# Patient Record
Sex: Female | Born: 1970 | Hispanic: Yes | Marital: Single | Smoking: Never smoker
Health system: Southern US, Community
[De-identification: ages and names within clinical notes are randomized; demographics above are authoritative.]

## PROBLEM LIST (undated history)

## (undated) DIAGNOSIS — E66811 Obesity, class 1: Secondary | ICD-10-CM

## (undated) DIAGNOSIS — E669 Obesity, unspecified: Secondary | ICD-10-CM

## (undated) DIAGNOSIS — I10 Essential (primary) hypertension: Secondary | ICD-10-CM

## (undated) DIAGNOSIS — E119 Type 2 diabetes mellitus without complications: Secondary | ICD-10-CM

## (undated) HISTORY — DX: Type 2 diabetes mellitus without complications: E11.9

## (undated) HISTORY — PX: TUBAL LIGATION: SHX77

## (undated) HISTORY — DX: Obesity, class 1: E66.811

## (undated) HISTORY — DX: Obesity, unspecified: E66.9

## (undated) HISTORY — DX: Essential (primary) hypertension: I10

---

## 2014-10-30 DIAGNOSIS — I10 Essential (primary) hypertension: Secondary | ICD-10-CM | POA: Insufficient documentation

## 2014-10-30 DIAGNOSIS — E119 Type 2 diabetes mellitus without complications: Secondary | ICD-10-CM

## 2014-10-30 HISTORY — DX: Essential (primary) hypertension: I10

## 2014-10-30 HISTORY — DX: Type 2 diabetes mellitus without complications: E11.9

## 2017-07-20 ENCOUNTER — Encounter: Payer: Self-pay | Admitting: Family Medicine

## 2017-07-20 ENCOUNTER — Ambulatory Visit (INDEPENDENT_AMBULATORY_CARE_PROVIDER_SITE_OTHER): Payer: Self-pay | Admitting: Family Medicine

## 2017-07-20 VITALS — BP 201/115 | HR 88 | Temp 98.9°F | Resp 16 | Ht 60.0 in | Wt 149.0 lb

## 2017-07-20 DIAGNOSIS — I10 Essential (primary) hypertension: Secondary | ICD-10-CM

## 2017-07-20 MED ORDER — LISINOPRIL-HYDROCHLOROTHIAZIDE 20-12.5 MG PO TABS: 1.0000 | ORAL_TABLET | Freq: Every day | ORAL | 2 refills | Status: DC

## 2017-07-20 NOTE — Patient Instructions (Signed)
     IF you received an x-ray today, you will receive an invoice from [facility]. Please contact [facility] at [phone] with questions or concerns regarding your invoice.   IF you received labwork today, you will receive an invoice from [facility]. Please contact [facility] at [phone] with questions or concerns regarding your invoice.   Our billing staff will not be able to assist you with questions regarding bills from these companies.  You will be contacted with the lab results as soon as they are available. The fastest way to get your results is to activate your My Chart account. Instructions are located on the last page of this paperwork. If you have not heard from us regarding the results in 2 weeks, please contact this office.     

## 2017-07-20 NOTE — Progress Notes (Signed)
9/21/20183:00 PM  Tamara Skinner 1971/06/04, 46 y.o. female [id number]  Chief Complaint  Patient presents with  . Blood Pressure Check    HPI:   Patient is a 46 y.o. female who presents today for BP check. She states she was at the dentist yesterday and was found to have elevated BP, similar to today.  She states she has been told in the past she had elevated BP. She reports very strong family history of HTN with all her siblings having She reports her father has had 4 strokes.  She has never taken medication for HTN  She denies regular NSAID, caffeine use.   She does not smoke.  She reports mild headache but otherwise is feeling fine.  She last saw a medical provider about 6 years ago.  Depression screen PHQ 2/9 07/20/2017  Decreased Interest 0  Down, Depressed, Hopeless 0  PHQ - 2 Score 0    Not on File  No current outpatient prescriptions on file prior to visit.   No current facility-administered medications on file prior to visit.     Past Medical History:  Diagnosis Date  . Hypertension     No past surgical history on file.  Social History  Substance Use Topics  . Smoking status: Never Smoker  . Smokeless tobacco: Never Used  . Alcohol use Not on file    Family History  Problem Relation Age of Onset  . Diabetes Mother   . Hypertension Father     Review of Systems  Constitutional: Negative for chills and fever.  Eyes: Negative for blurred vision and double vision.  Respiratory: Negative for cough and shortness of breath.   Cardiovascular: Negative for chest pain, palpitations, orthopnea, leg swelling and PND.  Gastrointestinal: Negative for abdominal pain, nausea and vomiting.  Genitourinary: Negative for hematuria.  Musculoskeletal: Negative for back pain.  Neurological: Positive for headaches. Negative for dizziness, sensory change, speech change and focal weakness.     OBJECTIVE:  Blood pressure (!) 201/115, pulse 88, temperature 98.9 F  (37.2 C), temperature source Oral, resp. rate 16, height 5' (1.524 m), weight 149 lb (67.6 kg), SpO2 98 %.  Physical Exam  Constitutional: She is oriented to person, place, and time and well-developed, well-nourished, and in no distress.  HENT:  Head: Normocephalic and atraumatic.  Mouth/Throat: Oropharynx is clear and moist. No oropharyngeal exudate.  Eyes: Pupils are equal, round, and reactive to light. EOM are normal. No scleral icterus.  Neck: Neck supple. Carotid bruit is not present. No thyroid mass and no thyromegaly present.  Cardiovascular: Normal rate, regular rhythm, S1 normal, S2 normal and intact distal pulses.  Exam reveals no gallop and no friction rub.   Murmur heard.  Decrescendo systolic murmur is present with a grade of 2/6  Pulmonary/Chest: Effort normal and breath sounds normal. She has no wheezes. She has no rales.  Abdominal: Soft. Bowel sounds are normal. She exhibits no distension and no mass. There is no tenderness.  Musculoskeletal: She exhibits no edema.  Neurological: She is alert and oriented to person, place, and time. Gait normal.  Skin: Skin is warm and dry.    No results found for this or any previous visit.   ASSESSMENT and PLAN:  1. Essential hypertension, benign Discussed LFM, importance of low salt, low fat diet and regular exercise. Starting on ACE/HCTZ. Discussed new med r/se/b. Labs per below.  ER precautions given.  - CBC with Differential - Comprehensive metabolic panel - TSH - Lipid panel -  EKG 12-Lead - NSR, HR 79, LAD, LAE, nonspecific inverted T waves V1, V2  Meds ordered this encounter  Medications  . lisinopril-hydrochlorothiazide (ZESTORETIC) 20-12.5 MG tablet    Sig: Take 1 tablet by mouth daily.    Dispense:  30 tablet    Refill:  2          [name] Guadalupe Dawn, MD Primary Care at [facility] [address] [place], [place] [address] Ph.  [phone] Fax [phone]

## 2017-07-21 LAB — COMPREHENSIVE METABOLIC PANEL
ALT: 90 IU/L — ABNORMAL HIGH (ref 0–32)
AST: 74 IU/L — ABNORMAL HIGH (ref 0–40)
Albumin/Globulin Ratio: 1.1 — ABNORMAL LOW (ref 1.2–2.2)
Albumin: 4.1 g/dL (ref 3.5–5.5)
Alkaline Phosphatase: 153 IU/L — ABNORMAL HIGH (ref 39–117)
BUN/Creatinine Ratio: 19 (ref 9–23)
BUN: 10 mg/dL (ref 6–24)
Bilirubin Total: 0.3 mg/dL (ref 0.0–1.2)
CO2: 26 mmol/L (ref 20–29)
Calcium: 9.2 mg/dL (ref 8.7–10.2)
Chloride: 100 mmol/L (ref 96–106)
Creatinine, Ser: 0.54 mg/dL — ABNORMAL LOW (ref 0.57–1.00)
GFR calc Af Amer: 129 mL/min/{1.73_m2} (ref >59)
GFR calc non Af Amer: 112 mL/min/{1.73_m2} (ref >59)
Globulin, Total: 3.6 g/dL (ref 1.5–4.5)
Glucose: 255 mg/dL — ABNORMAL HIGH (ref 65–99)
Potassium: 4.2 mmol/L (ref 3.5–5.2)
Sodium: 139 mmol/L (ref 134–144)
Total Protein: 7.7 g/dL (ref 6.0–8.5)

## 2017-07-21 LAB — CBC WITH DIFFERENTIAL/PLATELET
Basophils Absolute: 0 10*3/uL (ref 0.0–0.2)
Basos: 0 %
EOS (ABSOLUTE): 0.1 10*3/uL (ref 0.0–0.4)
Eos: 1 %
Hematocrit: 37.8 % (ref 34.0–46.6)
Hemoglobin: 11.2 g/dL (ref 11.1–15.9)
Immature Grans (Abs): 0 10*3/uL (ref 0.0–0.1)
Immature Granulocytes: 0 %
Lymphocytes Absolute: 1.1 10*3/uL (ref 0.7–3.1)
Lymphs: 18 %
MCH: 20.1 pg — ABNORMAL LOW (ref 26.6–33.0)
MCHC: 29.6 g/dL — ABNORMAL LOW (ref 31.5–35.7)
MCV: 68 fL — ABNORMAL LOW (ref 79–97)
Monocytes Absolute: 0.5 10*3/uL (ref 0.1–0.9)
Monocytes: 8 %
Neutrophils Absolute: 4.6 10*3/uL (ref 1.4–7.0)
Neutrophils: 73 %
Platelets: 259 10*3/uL (ref 150–379)
RBC: 5.58 x10E6/uL — ABNORMAL HIGH (ref 3.77–5.28)
RDW: 19.9 % — ABNORMAL HIGH (ref 12.3–15.4)
WBC: 6.3 10*3/uL (ref 3.4–10.8)

## 2017-07-21 LAB — LIPID PANEL
Chol/HDL Ratio: 3.6 ratio (ref 0.0–4.4)
Cholesterol, Total: 164 mg/dL (ref 100–199)
HDL: 46 mg/dL (ref >39)
LDL Calculated: 74 mg/dL (ref 0–99)
Triglycerides: 219 mg/dL — ABNORMAL HIGH (ref 0–149)
VLDL Cholesterol Cal: 44 mg/dL — ABNORMAL HIGH (ref 5–40)

## 2017-07-21 LAB — TSH: TSH: 1.8 u[IU]/mL (ref 0.450–4.500)

## 2017-07-27 ENCOUNTER — Encounter: Payer: Self-pay | Admitting: Family Medicine

## 2017-07-27 ENCOUNTER — Ambulatory Visit (INDEPENDENT_AMBULATORY_CARE_PROVIDER_SITE_OTHER): Payer: Self-pay | Admitting: Family Medicine

## 2017-07-27 VITALS — BP 184/98 | HR 75 | Temp 98.3°F | Resp 18 | Ht 59.09 in | Wt 148.6 lb

## 2017-07-27 DIAGNOSIS — E1165 Type 2 diabetes mellitus with hyperglycemia: Secondary | ICD-10-CM

## 2017-07-27 DIAGNOSIS — Z5989 Other problems related to housing and economic circumstances: Secondary | ICD-10-CM | POA: Insufficient documentation

## 2017-07-27 DIAGNOSIS — Z598 Other problems related to housing and economic circumstances: Secondary | ICD-10-CM

## 2017-07-27 DIAGNOSIS — I10 Essential (primary) hypertension: Secondary | ICD-10-CM

## 2017-07-27 DIAGNOSIS — IMO0001 Reserved for inherently not codable concepts without codable children: Secondary | ICD-10-CM

## 2017-07-27 DIAGNOSIS — R7309 Other abnormal glucose: Secondary | ICD-10-CM

## 2017-07-27 LAB — POCT GLYCOSYLATED HEMOGLOBIN (HGB A1C): Hemoglobin A1C: 8.4

## 2017-07-27 MED ORDER — LISINOPRIL 40 MG PO TABS: 40.0000 mg | ORAL_TABLET | Freq: Every day | ORAL | 2 refills | Status: DC

## 2017-07-27 MED ORDER — CHLORTHALIDONE 25 MG PO TABS
25.0000 mg | ORAL_TABLET | Freq: Every day | ORAL | 2 refills | Status: DC
Start: 2017-07-27 — End: 2018-04-19

## 2017-07-27 NOTE — Progress Notes (Signed)
9/28/20181:13 PM  Tamara Skinner 02-08-1971, 46 y.o. female [id number]  Chief Complaint  Patient presents with  . Hypertension    f/u    HPI:   Patient is a 46 y.o. female with past medical history significant for HTN who presents today for fu. Tolerating new medication well, no side effects, feels BP control not lasting all day as she will get mild headaches about after 8 hours of taking medication.   Otherwise has no acute concerns today  Depression screen [facility] 2/9 07/27/2017 07/20/2017  Decreased Interest 0 0  Down, Depressed, Hopeless 0 0  PHQ - 2 Score 0 0    No Known Allergies  No current outpatient prescriptions on file prior to visit.   No current facility-administered medications on file prior to visit.     Past Medical History:  Diagnosis Date  . Hypertension     History reviewed. No pertinent surgical history.  Social History  Substance Use Topics  . Smoking status: Never Smoker  . Smokeless tobacco: Never Used  . Alcohol use No    Family History  Problem Relation Age of Onset  . Diabetes Mother   . Hypertension Father     Review of Systems  Constitutional: Negative for chills and fever.  Respiratory: Negative for cough and shortness of breath.   Cardiovascular: Negative for chest pain, palpitations and leg swelling.  Gastrointestinal: Negative for abdominal pain, nausea and vomiting.     OBJECTIVE:  Blood pressure (!) 198/100, pulse 75, temperature 98.3 F (36.8 C), temperature source Oral, resp. rate 18, height 4' 11.09" (1.501 m), weight 148 lb 9.6 oz (67.4 kg).  Physical Exam  Constitutional: She is oriented to person, place, and time and well-developed, well-nourished, and in no distress.  HENT:  Head: Normocephalic and atraumatic.  Mouth/Throat: Oropharynx is clear and moist. No oropharyngeal exudate.  Eyes: Pupils are equal, round, and reactive to light. EOM are normal. No scleral icterus.  Neck: Neck supple.  Cardiovascular: Normal  rate, regular rhythm and normal heart sounds.  Exam reveals no gallop and no friction rub.   No murmur heard. Pulmonary/Chest: Effort normal and breath sounds normal. She has no wheezes. She has no rales.  Musculoskeletal: She exhibits no edema.  Neurological: She is alert and oriented to person, place, and time. Gait normal.  Skin: Skin is warm and dry.    Recent Results (from the past 2160 hour(s))  CBC with Differential     Status: Abnormal   Collection Time: 07/20/17 12:25 PM  Result Value Ref Range   WBC 6.3 3.4 - 10.8 x10E3/uL   RBC 5.58 (H) 3.77 - 5.28 x10E6/uL   Hemoglobin 11.2 11.1 - 15.9 g/dL   Hematocrit 44.0 10.2 - 46.6 %   MCV 68 (L) 79 - 97 fL   MCH 20.1 (L) 26.6 - 33.0 pg   MCHC 29.6 (L) 31.5 - 35.7 g/dL   RDW 72.5 (H) 36.6 - 44.0 %   Platelets 259 150 - 379 x10E3/uL   Neutrophils 73 Not Estab. %   Lymphs 18 Not Estab. %   Monocytes 8 Not Estab. %   Eos 1 Not Estab. %   Basos 0 Not Estab. %   Neutrophils Absolute 4.6 1.4 - 7.0 x10E3/uL   Lymphocytes Absolute 1.1 0.7 - 3.1 x10E3/uL   Monocytes Absolute 0.5 0.1 - 0.9 x10E3/uL   EOS (ABSOLUTE) 0.1 0.0 - 0.4 x10E3/uL   Basophils Absolute 0.0 0.0 - 0.2 x10E3/uL   Immature Granulocytes 0 Not  Estab. %   Immature Grans (Abs) 0.0 0.0 - 0.1 x10E3/uL  Comprehensive metabolic panel     Status: Abnormal   Collection Time: 07/20/17 12:25 PM  Result Value Ref Range   Glucose 255 (H) 65 - 99 mg/dL   BUN 10 6 - 24 mg/dL   Creatinine, Ser 1.61 (L) 0.57 - 1.00 mg/dL   GFR calc non Af Amer 112 >59 mL/min/1.73   GFR calc Af Amer 129 >59 mL/min/1.73   BUN/Creatinine Ratio 19 9 - 23   Sodium 139 134 - 144 mmol/L   Potassium 4.2 3.5 - 5.2 mmol/L   Chloride 100 96 - 106 mmol/L   CO2 26 20 - 29 mmol/L   Calcium 9.2 8.7 - 10.2 mg/dL   Total Protein 7.7 6.0 - 8.5 g/dL   Albumin 4.1 3.5 - 5.5 g/dL   Globulin, Total 3.6 1.5 - 4.5 g/dL   Albumin/Globulin Ratio 1.1 (L) 1.2 - 2.2   Bilirubin Total 0.3 0.0 - 1.2 mg/dL   Alkaline  Phosphatase 153 (H) 39 - 117 IU/L   AST 74 (H) 0 - 40 IU/L   ALT 90 (H) 0 - 32 IU/L  TSH     Status: None   Collection Time: 07/20/17 12:25 PM  Result Value Ref Range   TSH 1.800 0.450 - 4.500 uIU/mL  Lipid panel     Status: Abnormal   Collection Time: 07/20/17 12:25 PM  Result Value Ref Range   Cholesterol, Total 164 100 - 199 mg/dL   Triglycerides 096 (H) 0 - 149 mg/dL   HDL 46 >04 mg/dL   VLDL Cholesterol Cal 44 (H) 5 - 40 mg/dL   LDL Calculated 74 0 - 99 mg/dL   Chol/HDL Ratio 3.6 0.0 - 4.4 ratio    Comment:                                   T. Chol/HDL Ratio                                             Men  Women                               1/2 Avg.Risk  3.4    3.3                                   Avg.Risk  5.0    4.4                                2X Avg.Risk  9.6    7.1                                3X Avg.Risk 23.4   11.0   POCT glycosylated hemoglobin (Hb A1C)     Status: None   Collection Time: 07/27/17  9:58 AM  Result Value Ref Range   Hemoglobin A1C 8.4      ASSESSMENT and PLAN  1. Essential hypertension, benign Above goal, continue titration of medications. Discussed importance of medication  adherence, diet and exercise. New meds r/se/b discussed.  - chlorthalidone (HYGROTON) 25 MG tablet; Take 1 tablet (25 mg total) by mouth daily. - lisinopril (PRINIVIL,ZESTRIL) 40 MG tablet; Take 1 tablet (40 mg total) by mouth daily.  2. Other abnormal glucose - POCT glycosylated hemoglobin (Hb A1C)  3. Uncontrolled type 2 diabetes mellitus without complication, without long-term current use of insulin (HCC) New diagnosis given today, A1c 8.4, strong family history. Spent 50% of this 25 minute visit explaining diagnosis, diet, carb counting, exercise. Consider starting low dose metformin at next visit. Patient educational handout given.  4. Uninsured Discussed [employer], provided informative handout.        No Follow-up on  file.    Myles Lipps, MD Primary Care at [facility] [address] [place], [place] [address] Ph.  [phone] Fax [phone]

## 2017-07-27 NOTE — Patient Instructions (Signed)
     IF you received an x-ray today, you will receive an invoice from [facility]. Please contact [facility] at [phone] with questions or concerns regarding your invoice.   IF you received labwork today, you will receive an invoice from [facility]. Please contact [facility] at [phone] with questions or concerns regarding your invoice.   Our billing staff will not be able to assist you with questions regarding bills from these companies.  You will be contacted with the lab results as soon as they are available. The fastest way to get your results is to activate your My Chart account. Instructions are located on the last page of this paperwork. If you have not heard from us regarding the results in 2 weeks, please contact this office.     

## 2017-08-28 ENCOUNTER — Ambulatory Visit (INDEPENDENT_AMBULATORY_CARE_PROVIDER_SITE_OTHER): Payer: Self-pay | Admitting: Family Medicine

## 2017-08-28 ENCOUNTER — Encounter: Payer: Self-pay | Admitting: Family Medicine

## 2017-08-28 VITALS — BP 150/88 | HR 69 | Temp 97.9°F | Resp 18 | Ht 59.09 in | Wt 148.0 lb

## 2017-08-28 DIAGNOSIS — IMO0001 Reserved for inherently not codable concepts without codable children: Secondary | ICD-10-CM

## 2017-08-28 DIAGNOSIS — Z23 Encounter for immunization: Secondary | ICD-10-CM

## 2017-08-28 DIAGNOSIS — E1165 Type 2 diabetes mellitus with hyperglycemia: Secondary | ICD-10-CM

## 2017-08-28 DIAGNOSIS — I10 Essential (primary) hypertension: Secondary | ICD-10-CM

## 2017-08-28 LAB — GLUCOSE, POCT (MANUAL RESULT ENTRY): POC Glucose: 174 mg/dl — AB (ref 70–99)

## 2017-08-28 MED ORDER — LOSARTAN POTASSIUM 100 MG PO TABS: 100.0000 mg | ORAL_TABLET | Freq: Every day | ORAL | 1 refills | Status: DC

## 2017-08-28 MED ORDER — METFORMIN HCL 500 MG PO TABS: 500.0000 mg | ORAL_TABLET | Freq: Two times a day (BID) | ORAL | 1 refills | Status: DC

## 2017-08-28 NOTE — Progress Notes (Signed)
10/30/20188:30 AM  Tamara Skinner Mar 21, 1971, 46 y.o. female [id number]  Chief Complaint  Patient presents with  . Follow-up    DM and HTN    HPI:   Patient is a 46 y.o. female with past medical history significant for HTN and DM2 who presents today for follow-up.  Overall doing ok and has no acute concerns today she is experiencing dry cough since she started the lisinopril She ran out of her chlorthalidone 3 days ago, denies any financial barriers or intolerance Struggling with low carb diet, mostly with soda, trying to work on smaller portions  Depression screen [facility] 2/9 08/28/2017 07/27/2017 07/20/2017  Decreased Interest 0 0 0  Down, Depressed, Hopeless 0 0 0  PHQ - 2 Score 0 0 0    No Known Allergies  Prior to Admission medications   Medication Sig Start Date End Date Taking? Authorizing Provider  lisinopril (PRINIVIL,ZESTRIL) 40 MG tablet Take 1 tablet (40 mg total) by mouth daily. 07/27/17  Yes Myles Lipps, MD  chlorthalidone (HYGROTON) 25 MG tablet Take 1 tablet (25 mg total) by mouth daily. Patient not taking: Reported on 08/28/2017 07/27/17   Myles Lipps, MD    Past Medical History:  Diagnosis Date  . Hypertension     No past surgical history on file.  Social History  Substance Use Topics  . Smoking status: Never Smoker  . Smokeless tobacco: Never Used  . Alcohol use No    Family History  Problem Relation Age of Onset  . Diabetes Mother   . Hypertension Father     Review of Systems  Constitutional: Negative for chills and fever.  Respiratory: Negative for cough and shortness of breath.   Cardiovascular: Negative for chest pain, palpitations and leg swelling.  Gastrointestinal: Negative for abdominal pain, nausea and vomiting.     OBJECTIVE:  Blood pressure (!) 150/88, pulse 69, temperature 97.9 F (36.6 C), temperature source Oral, resp. rate 18, height 4' 11.09" (1.501 m), weight 148 lb (67.1 kg), SpO2 100 %.  Physical Exam    Constitutional: She is oriented to person, place, and time and well-developed, well-nourished, and in no distress.  HENT:  Head: Normocephalic and atraumatic.  Mouth/Throat: Oropharynx is clear and moist. No oropharyngeal exudate.  Eyes: Pupils are equal, round, and reactive to light. EOM are normal. No scleral icterus.  Neck: Neck supple.  Cardiovascular: Normal rate, regular rhythm and normal heart sounds.  Exam reveals no gallop and no friction rub.   No murmur heard. Pulmonary/Chest: Effort normal and breath sounds normal. She has no wheezes. She has no rales.  Musculoskeletal: She exhibits no edema.  Neurological: She is alert and oriented to person, place, and time. Gait normal.  Skin: Skin is warm and dry.     Wt Readings from Last 3 Encounters:  08/28/17 148 lb (67.1 kg)  07/27/17 148 lb 9.6 oz (67.4 kg)  07/20/17 149 lb (67.6 kg)    Results for orders placed or performed in visit on 08/28/17 (from the past 24 hour(s))  POCT glucose (manual entry)     Status: Abnormal   Collection Time: 08/28/17  8:56 AM  Result Value Ref Range   POC Glucose 174 (A) 70 - 99 mg/dl     ASSESSMENT and PLAN  1. Essential hypertension, benign Much improved, changing to ARB. Cont chlorthalidone. Rechecking BMP today. - Basic metabolic panel - losartan (COZAAR) 100 MG tablet; Take 1 tablet (100 mg total) by mouth daily.  2. Uncontrolled type  2 diabetes mellitus without complication, without long-term current use of insulin (HCC) Fasting above goal today, 174.Marland Kitchen. Discussed diet changes. Trouble shooting around struggles. Adding metformin, r/se/b discussed. - POCT glucose (manual entry) - Microalbumin/Creatinine Ratio, Urine - metFORMIN (GLUCOPHAGE) 500 MG tablet; Take 1 tablet (500 mg total) by mouth 2 (two) times daily with a meal.   3. Need for vaccination Given today. - Pneumococcal conjugate vaccine 13-valent IM - Tdap vaccine greater than or equal to 7yo IM   Return in about 2  months (around 10/28/2017).    Myles LippsIrma M Santiago, MD Primary Care at [facility] [address] [place], [place] [address] Ph.  [phone] Fax [phone]

## 2017-08-28 NOTE — Assessment & Plan Note (Signed)
BP significantly improved. Off chorthalidone for past 3 days.  Changing ACE to ARB due to cough.

## 2017-08-28 NOTE — Patient Instructions (Signed)
     IF you received an x-ray today, you will receive an invoice from [facility]. Please contact [facility] at [phone] with questions or concerns regarding your invoice.   IF you received labwork today, you will receive an invoice from [facility]. Please contact [facility] at [phone] with questions or concerns regarding your invoice.   Our billing staff will not be able to assist you with questions regarding bills from these companies.  You will be contacted with the lab results as soon as they are available. The fastest way to get your results is to activate your My Chart account. Instructions are located on the last page of this paperwork. If you have not heard from us regarding the results in 2 weeks, please contact this office.    We recommend that you schedule a mammogram for breast cancer screening. Typically, you do not need a referral to do this. Please contact a local imaging center to schedule your mammogram.  [facility] - [phone]  *ask for the Radiology Department [facility]) - [phone] or [phone]  [facility] - [phone] [facility] - [phone] [facility] - [phone]  *ask for the Radiology Department [facility] - [phone]  *ask for the Radiology Department [facility] - [phone]  *ask for the Mammography Department [facility] - [phone] 

## 2017-08-29 LAB — MICROALBUMIN / CREATININE URINE RATIO
Creatinine, Urine: 80.8 mg/dL
Microalb/Creat Ratio: 45.4 mg/g creat — ABNORMAL HIGH (ref 0.0–30.0)
Microalbumin, Urine: 36.7 ug/mL

## 2017-08-29 LAB — BASIC METABOLIC PANEL
BUN/Creatinine Ratio: 30 — ABNORMAL HIGH (ref 9–23)
BUN: 19 mg/dL (ref 6–24)
CO2: 24 mmol/L (ref 20–29)
Calcium: 9.3 mg/dL (ref 8.7–10.2)
Chloride: 97 mmol/L (ref 96–106)
Creatinine, Ser: 0.63 mg/dL (ref 0.57–1.00)
GFR calc Af Amer: 123 mL/min/{1.73_m2} (ref >59)
GFR calc non Af Amer: 106 mL/min/{1.73_m2} (ref >59)
Glucose: 165 mg/dL — ABNORMAL HIGH (ref 65–99)
Potassium: 4.1 mmol/L (ref 3.5–5.2)
Sodium: 135 mmol/L (ref 134–144)

## 2017-12-28 ENCOUNTER — Other Ambulatory Visit: Payer: Self-pay | Admitting: Family Medicine

## 2018-02-23 ENCOUNTER — Other Ambulatory Visit: Payer: Self-pay | Admitting: Family Medicine

## 2018-04-19 ENCOUNTER — Ambulatory Visit: Payer: Self-pay | Admitting: Internal Medicine

## 2018-04-19 ENCOUNTER — Encounter: Payer: Self-pay | Admitting: Internal Medicine

## 2018-04-19 VITALS — BP 220/120 | HR 80 | Resp 12 | Ht <= 58 in | Wt 152.0 lb

## 2018-04-19 DIAGNOSIS — IMO0001 Reserved for inherently not codable concepts without codable children: Secondary | ICD-10-CM

## 2018-04-19 DIAGNOSIS — Z6831 Body mass index (BMI) 31.0-31.9, adult: Secondary | ICD-10-CM

## 2018-04-19 DIAGNOSIS — E669 Obesity, unspecified: Secondary | ICD-10-CM

## 2018-04-19 DIAGNOSIS — E1165 Type 2 diabetes mellitus with hyperglycemia: Secondary | ICD-10-CM

## 2018-04-19 DIAGNOSIS — I10 Essential (primary) hypertension: Secondary | ICD-10-CM

## 2018-04-19 LAB — GLUCOSE, POCT (MANUAL RESULT ENTRY): POC Glucose: 194 mg/dl — AB (ref 70–99)

## 2018-04-19 MED ORDER — HYDROCHLOROTHIAZIDE 25 MG PO TABS
ORAL_TABLET | ORAL | 11 refills | Status: DC
Start: 2018-04-19 — End: 2019-04-16

## 2018-04-19 MED ORDER — METFORMIN HCL 1000 MG PO TABS: 1000.0000 mg | ORAL_TABLET | Freq: Two times a day (BID) | ORAL | 11 refills | Status: DC

## 2018-04-19 MED ORDER — LOSARTAN POTASSIUM 100 MG PO TABS: ORAL_TABLET | ORAL | 11 refills | Status: DC

## 2018-04-19 NOTE — Progress Notes (Signed)
Subjective:    Patient ID: Tamara Skinner, female    DOB: 01/12/1971, 47 y.o.   MRN: [id number]  HPI   Here to establish Out of meds since April.  1.  Essential Hypertension:  Diagnosed 2 years ago.  Has been taking Losartan 100 mg daily and Chlorthalidone 25 mg daily.  Her bp was not at goal with that combination. Was following [facility] previously, but unable to continue there as too expensive.  2.  DM:  Diagnosed 2 years ago.   Last A1C was 8.4% on 07/27/2017. Most recently taking Metformin 500 mg twice daily with meals.    3.  Dyslipidemia:  07/20/2017 LDL not quite at goal at 74 and triglycerides elevated at 219.  Rest of panel at goal.  Lipid Panel     Component Value Date/Time   CHOL 164 07/20/2017 1225   TRIG 219 (H) 07/20/2017 1225   HDL 46 07/20/2017 1225   CHOLHDL 3.6 07/20/2017 1225   LDLCALC 74 07/20/2017 1225   4.  Obesity:  States her job already has too much physical activity.  Up at 6 a.m.  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  10 a.m.:  First time she eats at her work break.  Cereal--corn flakes.  Bananas, apples, almonds or other nuts.  Small amount 2% milk.  May have white bread.  1 p.m.:  2 fried eggs.  Corn oil to fry.  Boiled beans.  Sometimes chicken.  Sometimes salad:  Tomato, spinach, onion, broccoli with lime juice. Water, sometimes Fanta orange.  Home between 5-6 p.m.  5 p.m.:  [place], mushrooms, corn tortilla, Plantains.  Corn on the cob.  Pumpkin.  Vegetable soup with chicken.  Tea.  May play soccer with her grandkids once to twice weekly.  1-2 hours.  She feels she is running much of the time.     Current Meds  Medication Sig  . metFORMIN (GLUCOPHAGE) 500 MG tablet TAKE 1 TABLET(500 MG) BY MOUTH TWICE DAILY WITH A MEAL     Allergies  Allergen Reactions  . Lisinopril Cough   Past Medical History:  Diagnosis Date  . Diabetes mellitus without complication (HCC) 2016  . Hypertension 2016        family history includes Diabetes in her mother; Hypertension in her father.   Social History   Socioeconomic History  . Marital status: Single    Spouse name: Not on file  . Number of children: Not on file  . Years of education: Not on file  . Highest education level: Not on file  Occupational History  . Occupation: Engineer, maintenance (IT)Laundry service  Social Needs  . Financial resource strain: Not on file  . Food insecurity:    Worry: Not on file    Inability: Not on file  . Transportation needs:    Medical: Not on file    Non-medical: Not on file  Tobacco Use  . Smoking status: Never Smoker  . Smokeless tobacco: Never Used  Substance and Sexual Activity  . Alcohol use: No  . Drug use: No  . Sexual activity: Not on file  Lifestyle  . Physical activity:    Days per week: Not on file    Minutes per session: Not on file  . Stress: Not on file  Relationships  . Social connections:    Talks on phone: Not on file    Gets together: Not on file    Attends religious service: Not on  file    Active member of club or organization: Not on file    Attends meetings of clubs or organizations: Not on file    Relationship status: Not on file  . Intimate partner violence:    Fear of current or ex partner: Not on file    Emotionally abused: Not on file    Physically abused: Not on file    Forced sexual activity: Not on file  Other Topics Concern  . Not on file  Social History Narrative   Lives alone in [place]           Review of Systems     Objective:   Physical Exam NAD HEENT:  PERRL EOMI, discs sharp bilaterally, TMs pearly gray, throat without injection. Neck:  Supple, No adenopathy Chest:  CTA CV:  RRR with normal S1 and S2, No S3, S4 or murmur.  No carotid bruits.  Carotid, radial and DP pulses normal and equal. LE:  No edema      Assessment & Plan:  1.  DM:  Increase Metformin to 1000 mg twice daily.  To document blood glucoses.  Follow upin 3 months.  2.   Hypertension:  Losartan 100 mg daily and HCTZ 25 mg daily in morning together. BP and pulse with bmp in 1 week.  3.  Obesity:  Encouraged lifestyle changes with diet and increased daily physical activity.  Fasting labs in 3 months, then with me 2 days later.

## 2018-04-19 NOTE — Patient Instructions (Signed)

## 2018-04-23 ENCOUNTER — Telehealth: Payer: Self-pay | Admitting: Licensed Clinical Social Worker

## 2018-04-23 NOTE — Telephone Encounter (Signed)
LCSW called patient in order to introduce self and provide information about social work services available at the clinic. Left VM. 

## 2018-04-26 ENCOUNTER — Ambulatory Visit: Payer: Self-pay

## 2018-04-26 VITALS — BP 180/110 | HR 80

## 2018-04-26 DIAGNOSIS — I10 Essential (primary) hypertension: Secondary | ICD-10-CM

## 2018-07-19 ENCOUNTER — Other Ambulatory Visit: Payer: Self-pay

## 2018-07-25 ENCOUNTER — Ambulatory Visit: Payer: Self-pay | Admitting: Internal Medicine

## 2018-07-25 ENCOUNTER — Other Ambulatory Visit: Payer: Self-pay

## 2018-07-25 DIAGNOSIS — Z13220 Encounter for screening for lipoid disorders: Secondary | ICD-10-CM

## 2018-07-25 DIAGNOSIS — IMO0001 Reserved for inherently not codable concepts without codable children: Secondary | ICD-10-CM

## 2018-07-25 DIAGNOSIS — Z79899 Other long term (current) drug therapy: Secondary | ICD-10-CM

## 2018-07-25 DIAGNOSIS — E1165 Type 2 diabetes mellitus with hyperglycemia: Principal | ICD-10-CM

## 2018-07-26 LAB — COMPREHENSIVE METABOLIC PANEL
A/G RATIO: 1.2 (ref 1.2–2.2)
ALK PHOS: 97 IU/L (ref 39–117)
ALT: 40 IU/L — AB (ref 0–32)
AST: 38 IU/L (ref 0–40)
Albumin: 4.2 g/dL (ref 3.5–5.5)
BILIRUBIN TOTAL: 0.4 mg/dL (ref 0.0–1.2)
BUN/Creatinine Ratio: 25 — ABNORMAL HIGH (ref 9–23)
BUN: 18 mg/dL (ref 6–24)
CHLORIDE: 98 mmol/L (ref 96–106)
CO2: 25 mmol/L (ref 20–29)
Calcium: 9.4 mg/dL (ref 8.7–10.2)
Creatinine, Ser: 0.72 mg/dL (ref 0.57–1.00)
GFR calc Af Amer: 114 mL/min/{1.73_m2} (ref >59)
GFR calc non Af Amer: 99 mL/min/{1.73_m2} (ref >59)
Globulin, Total: 3.4 g/dL (ref 1.5–4.5)
Glucose: 95 mg/dL (ref 65–99)
POTASSIUM: 3.8 mmol/L (ref 3.5–5.2)
Sodium: 138 mmol/L (ref 134–144)
Total Protein: 7.6 g/dL (ref 6.0–8.5)

## 2018-07-26 LAB — MICROALBUMIN / CREATININE URINE RATIO
Creatinine, Urine: 77.7 mg/dL
MICROALB/CREAT RATIO: 35.3 mg/g{creat} — AB (ref 0.0–30.0)
MICROALBUM., U, RANDOM: 27.4 ug/mL

## 2018-07-26 LAB — LIPID PANEL W/O CHOL/HDL RATIO
Cholesterol, Total: 151 mg/dL (ref 100–199)
HDL: 48 mg/dL (ref >39)
LDL CALC: 85 mg/dL (ref 0–99)
TRIGLYCERIDES: 88 mg/dL (ref 0–149)
VLDL CHOLESTEROL CAL: 18 mg/dL (ref 5–40)

## 2018-07-26 LAB — HGB A1C W/O EAG: HEMOGLOBIN A1C: 6.5 % — AB (ref 4.8–5.6)

## 2018-08-08 ENCOUNTER — Ambulatory Visit: Payer: Self-pay | Admitting: Internal Medicine

## 2018-08-08 ENCOUNTER — Encounter: Payer: Self-pay | Admitting: Internal Medicine

## 2018-08-08 VITALS — BP 200/110 | HR 76 | Resp 12 | Ht <= 58 in | Wt 142.0 lb

## 2018-08-08 DIAGNOSIS — E119 Type 2 diabetes mellitus without complications: Secondary | ICD-10-CM

## 2018-08-08 DIAGNOSIS — I10 Essential (primary) hypertension: Secondary | ICD-10-CM

## 2018-08-08 DIAGNOSIS — E7800 Pure hypercholesterolemia, unspecified: Secondary | ICD-10-CM | POA: Insufficient documentation

## 2018-08-08 DIAGNOSIS — B351 Tinea unguium: Secondary | ICD-10-CM

## 2018-08-08 DIAGNOSIS — K029 Dental caries, unspecified: Secondary | ICD-10-CM

## 2018-08-08 DIAGNOSIS — B353 Tinea pedis: Secondary | ICD-10-CM

## 2018-08-08 DIAGNOSIS — R809 Proteinuria, unspecified: Secondary | ICD-10-CM | POA: Insufficient documentation

## 2018-08-08 LAB — GLUCOSE, POCT (MANUAL RESULT ENTRY): POC Glucose: 140 mg/dl — AB (ref 70–99)

## 2018-08-08 MED ORDER — AGAMATRIX ULTRA-THIN LANCETS MISC: 11 refills | Status: DC

## 2018-08-08 MED ORDER — AGAMATRIX PRESTO W/DEVICE KIT: PACK | 0 refills | Status: AC

## 2018-08-08 MED ORDER — GLUCOSE BLOOD VI STRP: ORAL_STRIP | 11 refills | Status: DC

## 2018-08-08 MED ORDER — AMLODIPINE BESYLATE 5 MG PO TABS: 5.0000 mg | ORAL_TABLET | Freq: Every day | ORAL | 11 refills | Status: DC

## 2018-08-08 NOTE — Progress Notes (Signed)
   Subjective:    Patient ID: Tamara Skinner, female    DOB: 1971/04/05, 47 y.o.   MRN: [id number]  HPI   1.  Essential Hypertension:  Taking Losartan and HCTZ every day.   Microalbumin/crea still a bit high, but improved with recent labs.     2.  DM:  A1C is down to 6.5% from 8.4% 06/2017.  She is not checking sugars at home.  Does not have a glucometer or test strips. Last eye exam:  Never. Pneumococcal not given:  Out in clinic currently. Needs influenza  3.  Elevated LDL/dyslipidemia:  Triglycerides down and LDL up a bit.  Not on any medication as LDL close to goal last check.  Lipid Panel     Component Value Date/Time   CHOL 151 07/25/2018 0900   TRIG 88 07/25/2018 0900   HDL 48 07/25/2018 0900   CHOLHDL 3.6 07/20/2017 1225   LDLCALC 85 07/25/2018 0900    Current Meds  Medication Sig  . hydrochlorothiazide (HYDRODIURIL) 25 MG tablet 1 tab by mouth daily with Losartan in morning  . losartan (COZAAR) 100 MG tablet 1 tab by mouth daily in morning with HCTZ  . metFORMIN (GLUCOPHAGE) 1000 MG tablet Take 1 tablet (1,000 mg total) by mouth 2 (two) times daily with a meal.   Review of Systems     Objective:   Physical Exam  NAD HEENT:  Multiple caried teeth with mild inflammation of surrounding gingiva, especially a broken off tooth in left mandibular gingiva. TMs pearly gray, throat without injection.  Nasal mucosa mildly inflamed with clear discharge Chest:  CTA CV:  RRR with normal S1 and S2, No S3, S4 or murmur.  Radial and DP pulses normal and equal Abd:  S, NT, No HSM or mass, + BS LE:  No edema  Diabetic Foot Exam - Simple   Simple Foot Form Diabetic Foot exam was performed with the following findings:  Yes 08/08/2018 10:43 AM  Visual Inspection No deformities, no ulcerations, no other skin breakdown bilaterally:  Yes See comments:  Yes Sensation Testing Intact to touch and monofilament testing bilaterally:  Yes Pulse Check Posterior Tibialis and Dorsalis pulse  intact bilaterally:  Yes Comments Great toenails bilaterally with discoloration and crumbling, broken off near distal aspect.  Plantar aspects of feet with flaking.        Assessment & Plan:  1.  DM:  Will call when we get Pneumococcal vaccine in. Referral for diabetic eye appt Well controlled currently A1C in 3 months before CPE  2.  Hypertension:  Not controlled and with microalbuminuria, though improved.  Add Amlodipine 5 mg daily.  Nurse visit for bp and pulse check in 1 week.  3.  Tinea pedis and toenail onychomycosis:  Will treat once we have her bp under control with new med.  4.  Elevated ALT:  Recheck with fasting labs in 3 months  5.  Dyslipidemia/elevated LDL:  To work on diet and physical activity to get LDL back down.  6.  Dental decay:  Dental referral.  CPE without pap as had one last year in 3 months

## 2018-08-14 ENCOUNTER — Ambulatory Visit: Payer: Self-pay

## 2018-08-14 VITALS — BP 152/100 | HR 70

## 2018-08-14 DIAGNOSIS — I10 Essential (primary) hypertension: Secondary | ICD-10-CM

## 2018-08-14 NOTE — Progress Notes (Signed)
Patient had only been on additional BP med for 4 days. Patient started amlodipine on Sunday 08/11/18. Patient will come back in 1 week for repeat BP check.. Patient verbalized understanding

## 2018-10-30 DIAGNOSIS — E785 Hyperlipidemia, unspecified: Secondary | ICD-10-CM | POA: Insufficient documentation

## 2018-10-30 HISTORY — DX: Hyperlipidemia, unspecified: E78.5

## 2018-11-19 ENCOUNTER — Other Ambulatory Visit: Payer: Self-pay

## 2018-11-21 ENCOUNTER — Ambulatory Visit: Payer: Self-pay | Admitting: Internal Medicine

## 2018-11-21 ENCOUNTER — Encounter: Payer: Self-pay | Admitting: Internal Medicine

## 2018-11-21 VITALS — BP 180/100 | HR 86 | Resp 12 | Ht <= 58 in | Wt 145.0 lb

## 2018-11-21 DIAGNOSIS — B351 Tinea unguium: Secondary | ICD-10-CM

## 2018-11-21 DIAGNOSIS — Z9189 Other specified personal risk factors, not elsewhere classified: Secondary | ICD-10-CM

## 2018-11-21 DIAGNOSIS — Z114 Encounter for screening for human immunodeficiency virus [HIV]: Secondary | ICD-10-CM

## 2018-11-21 DIAGNOSIS — R748 Abnormal levels of other serum enzymes: Secondary | ICD-10-CM | POA: Insufficient documentation

## 2018-11-21 DIAGNOSIS — Z0000 Encounter for general adult medical examination without abnormal findings: Secondary | ICD-10-CM

## 2018-11-21 DIAGNOSIS — R7401 Elevation of levels of liver transaminase levels: Secondary | ICD-10-CM | POA: Insufficient documentation

## 2018-11-21 DIAGNOSIS — Z1239 Encounter for other screening for malignant neoplasm of breast: Secondary | ICD-10-CM

## 2018-11-21 DIAGNOSIS — E7800 Pure hypercholesterolemia, unspecified: Secondary | ICD-10-CM

## 2018-11-21 DIAGNOSIS — E119 Type 2 diabetes mellitus without complications: Secondary | ICD-10-CM

## 2018-11-21 DIAGNOSIS — I10 Essential (primary) hypertension: Secondary | ICD-10-CM

## 2018-11-21 DIAGNOSIS — B353 Tinea pedis: Secondary | ICD-10-CM | POA: Insufficient documentation

## 2018-11-21 HISTORY — DX: Tinea unguium: B35.1

## 2018-11-21 HISTORY — DX: Tinea pedis: B35.3

## 2018-11-21 HISTORY — DX: Elevation of levels of liver transaminase levels: R74.01

## 2018-11-21 LAB — GLUCOSE, POCT (MANUAL RESULT ENTRY): POC GLUCOSE: 137 mg/dL — AB (ref 70–99)

## 2018-11-21 MED ORDER — TERBINAFINE HCL 250 MG PO TABS: ORAL_TABLET | ORAL | 0 refills | Status: DC

## 2018-11-21 MED ORDER — AMLODIPINE BESYLATE 10 MG PO TABS: ORAL_TABLET | ORAL | 11 refills | Status: DC

## 2018-11-21 NOTE — Progress Notes (Signed)
Subjective:   Patient ID: Tamara Skinner, female    DOB: 29-Jul-1971, 48 y.o.   MRN:   HPI   CPE without pap  1.  Pap:  Last pap was 2 years ago in [place] and normal.  Always normal.  No family history of cervical cancer.  2.  Mammogram:  Never.  No family history of breast cancer.    3.  Osteoprevention:  Eats dairy only once or twice weekly.  Does not like.  She does like almond milk.  She is walking 20 minutes.  Also, used to walk a lot with work, but not working currently.  She can increase her walking to 60 minutes.  4.  Guaiac Cards:  Never  5.  Colonoscopy:  Never.  No family history of colon cancer.  6.  Immunizations:  Immunization History  Administered Date(s) Administered  . Influenza,inj,Quad PF,6+ Mos 08/28/2017  . Influenza-Unspecified 08/07/2018  . Pneumococcal Conjugate-13 08/28/2017  . Tdap 08/28/2017     7.  Glucose/Cholesterol:  A1C in September was excellent at 6.5%. LDL slightly high in September as well.  Goal less than 70.  She is fasting today.  Lipid Panel     Component Value Date/Time   CHOL 151 07/25/2018 0900   TRIG 88 07/25/2018 0900   HDL 48 07/25/2018 0900   CHOLHDL 3.6 07/20/2017 1225   LDLCALC 85 07/25/2018 0900   Also:  Hypertension:  States taking her medication regularly.  Added Amlodipine in October.  Did not follow up as recommended.  Current Meds  Medication Sig  . AGAMATRIX ULTRA-THIN LANCETS MISC Check blood glucose twice weekly before breakfast and twice weekly before dinner.  Marland Kitchen amLODipine (NORVASC) 10 MG tablet 1 tab by mouth daily  . Blood Glucose Monitoring Suppl (AGAMATRIX PRESTO) w/Device KIT Check blood glucose in the morning before breakfast and before dinner each twice weekly  . glucose blood (AGAMATRIX PRESTO TEST) test strip Check blood glucose twice weekly before breakfast and twice weekly before dinner  . hydrochlorothiazide (HYDRODIURIL) 25 MG tablet 1 tab by mouth daily with Losartan in morning  . losartan  (COZAAR) 100 MG tablet 1 tab by mouth daily in morning with HCTZ  . metFORMIN (GLUCOPHAGE) 1000 MG tablet Take 1 tablet (1,000 mg total) by mouth 2 (two) times daily with a meal.  . [DISCONTINUED] amLODipine (NORVASC) 5 MG tablet Take 1 tablet (5 mg total) by mouth daily.    Allergies  Allergen Reactions  . Lisinopril Cough    Past Medical History:  Diagnosis Date  . Diabetes mellitus without complication ([place]) 0626  . Hypertension 2016    Past Surgical History:  Procedure Laterality Date  . TUBAL LIGATION  1927    Family History  Problem Relation Age of Onset  . Diabetes Mother   . Hypertension Father   . Stroke Father        Three strokes  . Diabetes Sister   . Hypertension Sister   . Diabetes Brother   . Hypertension Brother   . Diabetes Brother   . Hypertension Brother     Social History   Socioeconomic History  . Marital status: Single    Spouse name: Not on file  . Number of children: 4  . Years of education: 56  . Highest education level: 12th grade  Occupational History  . Occupation: Currently unemployed  Social Needs  . Financial resource strain: Not on file  . Food insecurity:    Worry: Sometimes true  Inability: Sometimes true  . Transportation needs:    Medical: Yes    Non-medical: Yes  Tobacco Use  . Smoking status: Never Smoker  . Smokeless tobacco: Never Used  Substance and Sexual Activity  . Alcohol use: No  . Drug use: No  . Sexual activity: Yes    Birth control/protection: Surgical  Lifestyle  . Physical activity:    Days per week: 7 days    Minutes per session: 20 min  . Stress: Very much  Relationships  . Social connections:    Talks on phone: More than three times a week    Gets together: Twice a week    Attends religious service: More than 4 times per year    Active member of club or organization: No    Attends meetings of clubs or organizations: Never    Relationship status: Never married  . Intimate partner violence:     Fear of current or ex partner: No    Emotionally abused: No    Physically abused: No    Forced sexual activity: No  Other Topics Concern  . Not on file  Social History Narrative   Moving in with son here in [place] soon as can no longer afford to live alone after losing job.   She will be living with her son, his wife and his 2 children     Review of Systems  Constitutional:       Poor sleep for 4 years.   Regular bedtime.  Difficulty initiating sleep.  Watches TV if unable to fall asleep.  Does like to read.   Wakes after 3-4 hours and unable to fall back to sleep. No napping.    Eyes: Negative for visual disturbance (Missed appt for eyes due to breakdown of car.).  Respiratory: Negative for cough and shortness of breath.   Cardiovascular: Negative for chest pain.  Gastrointestinal: Negative for abdominal pain and blood in stool.     Objective:  Physical Exam:  BP (!) 180/100 (BP Location: Left Arm, Patient Position: Sitting, Cuff Size: Normal)   Pulse 86   Resp 12   Ht _0  (1.448 m)   Wt 145 lb (65.8 kg)   LMP  (LMP Unknown)   BMI 31.38 kg/m .  NAD HEENT:  Has hair shaved to very short length.  PERRL, EOMI, Discs sharp. TMs pearly gray, throat without injection.   Neck: Supple, No adenopathy, no thyromegaly Chest:  CTA Breasts:  No focal mass, skin dimpling, nipple discharge or axillary adenopathy CV:  RRR with normal S1 and S2, No S3, S4 murmur or rub.  No carotid bruits.  Carotid, radial, femoral, DP or PT pulses. Abd:  S, NT, No HSM or mass, + BS GU:  Normal external female genitalia.  No discharge.  No uterine or adnexal mass or tenderness. Rectal:  Refused Skin:  Toenails with discoloration and thickening, crumbling.  Plantar aspect of feet with flaking and dryness.   Assessment & Plan:  1.  CPE Mammogram Guaiac Cards X3 HIV screen   2.  DM:  A1C Re refer for eye exam  3.  Hypertension:  Increase amlodipine to 10 mg daily.  Bp check in 1  week. Discussed needs to follow up regularly until we have her bp under good control.  4.  Elevated LDL:  FLP.  5.   Insomnia: went over sleep hygiene for improvement.  Will refer to SW if no improvement with followup in 3 months  6.  Toenail onychomycosis/Tinea pedis:  Terbinafine 250 mg for 84 days.  Follow up in 6 and 12 weeks.  7.  Hx of elevated ALT:  CMP

## 2018-11-21 NOTE — Patient Instructions (Signed)
For foot and toenail fungus:  Spray your shoes with Lysol or Lotrimin Antifungal spray when you start the medication for your feet.   Re-spray your the shoes you wear with each wearing and allow to dry before wearing again You will need to continue to spray the shoes you have during the infection of your toenails and feet have been thrown out due to wear.  Once your toenails and feet are clear of infection, the shoes you buy new do not necessarily need to be sprayed. Clean your shower floor once to twice daily with bleach containing cleaner. 

## 2018-11-22 LAB — COMPREHENSIVE METABOLIC PANEL
ALT: 58 IU/L — AB (ref 0–32)
AST: 46 IU/L — AB (ref 0–40)
Albumin/Globulin Ratio: 1.2 (ref 1.2–2.2)
Albumin: 4.3 g/dL (ref 3.8–4.8)
Alkaline Phosphatase: 121 IU/L — ABNORMAL HIGH (ref 39–117)
BUN/Creatinine Ratio: 30 — ABNORMAL HIGH (ref 9–23)
BUN: 17 mg/dL (ref 6–24)
Bilirubin Total: 0.3 mg/dL (ref 0.0–1.2)
CO2: 22 mmol/L (ref 20–29)
CREATININE: 0.56 mg/dL — AB (ref 0.57–1.00)
Calcium: 9.7 mg/dL (ref 8.7–10.2)
Chloride: 100 mmol/L (ref 96–106)
GFR calc Af Amer: 127 mL/min/{1.73_m2} (ref >59)
GFR, EST NON AFRICAN AMERICAN: 110 mL/min/{1.73_m2} (ref >59)
Globulin, Total: 3.5 g/dL (ref 1.5–4.5)
Glucose: 122 mg/dL — ABNORMAL HIGH (ref 65–99)
Potassium: 4.4 mmol/L (ref 3.5–5.2)
Sodium: 140 mmol/L (ref 134–144)
TOTAL PROTEIN: 7.8 g/dL (ref 6.0–8.5)

## 2018-11-22 LAB — LIPID PANEL W/O CHOL/HDL RATIO
CHOLESTEROL TOTAL: 163 mg/dL (ref 100–199)
HDL: 53 mg/dL (ref >39)
LDL CALC: 86 mg/dL (ref 0–99)
Triglycerides: 122 mg/dL (ref 0–149)
VLDL Cholesterol Cal: 24 mg/dL (ref 5–40)

## 2018-11-22 LAB — HGB A1C W/O EAG: HEMOGLOBIN A1C: 6.6 % — AB (ref 4.8–5.6)

## 2018-11-22 LAB — HIV ANTIBODY (ROUTINE TESTING W REFLEX): HIV Screen 4th Generation wRfx: NONREACTIVE

## 2018-12-01 ENCOUNTER — Other Ambulatory Visit: Payer: Self-pay | Admitting: Internal Medicine

## 2018-12-04 ENCOUNTER — Ambulatory Visit: Payer: Self-pay

## 2018-12-04 VITALS — BP 158/80 | HR 86

## 2018-12-04 DIAGNOSIS — I10 Essential (primary) hypertension: Secondary | ICD-10-CM

## 2018-12-04 DIAGNOSIS — Z1211 Encounter for screening for malignant neoplasm of colon: Secondary | ICD-10-CM

## 2018-12-04 LAB — POC HEMOCCULT BLD/STL (HOME/3-CARD/SCREEN)
Card #2 Fecal Occult Blod, POC: NEGATIVE
Card #3 Fecal Occult Blood, POC: NEGATIVE
Fecal Occult Blood, POC: NEGATIVE

## 2018-12-05 NOTE — Progress Notes (Signed)
Per Dr. Delrae Alfred have patient come back in 2 weeks and recheck after being on medication consistently for 1 month. Britt Boozer called patient and scheduled.

## 2019-01-02 ENCOUNTER — Encounter: Payer: Self-pay | Admitting: Internal Medicine

## 2019-01-02 ENCOUNTER — Ambulatory Visit: Payer: Self-pay | Admitting: Internal Medicine

## 2019-01-02 VITALS — BP 180/90 | HR 78 | Resp 12 | Ht <= 58 in | Wt 148.0 lb

## 2019-01-02 DIAGNOSIS — B351 Tinea unguium: Secondary | ICD-10-CM

## 2019-01-02 DIAGNOSIS — I10 Essential (primary) hypertension: Secondary | ICD-10-CM

## 2019-01-02 DIAGNOSIS — E119 Type 2 diabetes mellitus without complications: Secondary | ICD-10-CM

## 2019-01-02 DIAGNOSIS — Z79899 Other long term (current) drug therapy: Secondary | ICD-10-CM

## 2019-01-02 LAB — GLUCOSE, POCT (MANUAL RESULT ENTRY): POC Glucose: 140 mg/dl — AB (ref 70–99)

## 2019-01-02 NOTE — Progress Notes (Signed)
    Subjective:    Patient ID: Tamara Skinner, female   DOB: 03-18-1971, 48 y.o.   MRN: [id number]   HPI   1.   Hypertension:  States she is taking her meds.  Brought them, but out in car.    States she has not missed.  2.  Toenails onychomycosis:  Tolerating Terbinafine fine.  Toenails are improving.   She is treating shoes and shower floor as discussed  Current Meds  Medication Sig  . AGAMATRIX ULTRA-THIN LANCETS MISC Check blood glucose twice weekly before breakfast and twice weekly before dinner.  Marland Kitchen amLODipine (NORVASC) 10 MG tablet 1 tab by mouth daily  . aspirin 325 MG tablet Take 325 mg by mouth every 6 (six) hours as needed.  . Blood Glucose Monitoring Suppl (AGAMATRIX PRESTO) w/Device KIT Check blood glucose in the morning before breakfast and before dinner each twice weekly  . glucose blood (AGAMATRIX PRESTO TEST) test strip Check blood glucose twice weekly before breakfast and twice weekly before dinner  . hydrochlorothiazide (HYDRODIURIL) 25 MG tablet 1 tab by mouth daily with Losartan in morning  . losartan (COZAAR) 100 MG tablet 1 tab by mouth daily in morning with HCTZ  . metFORMIN (GLUCOPHAGE) 1000 MG tablet Take 1 tablet (1,000 mg total) by mouth 2 (two) times daily with a meal.  . terbinafine (LAMISIL) 250 MG tablet 1 tab by mouth daily for 84 days.   Allergies  Allergen Reactions  . Lisinopril Cough     Review of Systems    Objective:   BP (!) 180/90 (BP Location: Left Arm, Patient Position: Sitting, Cuff Size: Normal)   Pulse 78   Resp 12   Ht '4\' 9"'$  (1.448 m)   Wt 148 lb (67.1 kg)   BMI 32.03 kg/m   Physical Exam   Great toenails with a bit of polish on them, but clear 4 mm area at base of toenails that is clear and normal thickness bilaterally.   Assessment & Plan   1.  Hypertension:  Brings in bottles from car and appears to have 17 pills more than she should had she been taking her bp pills (amlodipine)  She has about 5 tabs too many of Losartan and  HCTZ than would expect.  States she had those left over from the previous month.  Unable to ascertain why she had meds left over.   Some of the Amlodipine left over as she switched from 5 mg to 10 mg tabs.    2.  Toenail onychomycosis:  Improving on Terbinafine.  Hepatic profile  2. Toenail onychomycosis:  Improving.  Has had minor elevation of transaminases prior to treatment.  Check hepatic profile again today.

## 2019-01-03 LAB — HEPATIC FUNCTION PANEL
ALT: 47 IU/L — ABNORMAL HIGH (ref 0–32)
AST: 37 IU/L (ref 0–40)
Albumin: 4 g/dL (ref 3.8–4.8)
Alkaline Phosphatase: 110 IU/L (ref 39–117)
Bilirubin Total: 0.3 mg/dL (ref 0.0–1.2)
Bilirubin, Direct: 0.11 mg/dL (ref 0.00–0.40)
Total Protein: 7.5 g/dL (ref 6.0–8.5)

## 2019-02-13 ENCOUNTER — Ambulatory Visit: Payer: Self-pay | Admitting: Internal Medicine

## 2019-04-16 ENCOUNTER — Other Ambulatory Visit: Payer: Self-pay | Admitting: Internal Medicine

## 2019-04-17 ENCOUNTER — Encounter: Payer: Self-pay | Admitting: Internal Medicine

## 2019-04-17 ENCOUNTER — Ambulatory Visit: Payer: Self-pay | Admitting: Internal Medicine

## 2019-04-17 ENCOUNTER — Other Ambulatory Visit: Payer: Self-pay

## 2019-04-17 ENCOUNTER — Ambulatory Visit (INDEPENDENT_AMBULATORY_CARE_PROVIDER_SITE_OTHER): Payer: Self-pay | Admitting: Internal Medicine

## 2019-04-17 VITALS — BP 150/82 | HR 70 | Resp 12 | Ht <= 58 in | Wt 152.0 lb

## 2019-04-17 DIAGNOSIS — R748 Abnormal levels of other serum enzymes: Secondary | ICD-10-CM

## 2019-04-17 DIAGNOSIS — E119 Type 2 diabetes mellitus without complications: Secondary | ICD-10-CM

## 2019-04-17 DIAGNOSIS — I10 Essential (primary) hypertension: Secondary | ICD-10-CM

## 2019-04-17 DIAGNOSIS — B351 Tinea unguium: Secondary | ICD-10-CM

## 2019-04-17 DIAGNOSIS — B353 Tinea pedis: Secondary | ICD-10-CM

## 2019-04-17 NOTE — Progress Notes (Signed)
    Subjective:    Patient ID: Tamara Skinner, female   DOB: 11-06-1970, 48 y.o.   MRN: [id number]   HPI   1.  Onychomycosis of great toenails:  States she finished Terbinafine about 1.5 weeks ago and should have finished mid April, 2 months ago.  Feels her nails are better, but not completely cleared yet.   2.  Hypertension:  Did not take her  meds today as she ran out.  3.  DM:  Out of Metformin for at least 1 day  Current Meds  Medication Sig  . AGAMATRIX ULTRA-THIN LANCETS MISC Check blood glucose twice weekly before breakfast and twice weekly before dinner.  Marland Kitchen amLODipine (NORVASC) 10 MG tablet 1 tab by mouth daily  . aspirin 325 MG tablet Take 325 mg by mouth every 6 (six) hours as needed.  . Blood Glucose Monitoring Suppl (AGAMATRIX PRESTO) w/Device KIT Check blood glucose in the morning before breakfast and before dinner each twice weekly  . glucose blood (AGAMATRIX PRESTO TEST) test strip Check blood glucose twice weekly before breakfast and twice weekly before dinner  . hydrochlorothiazide (HYDRODIURIL) 25 MG tablet TAKE 1 TABLET BY MOUTH ONCE DAILY WTIH  LOSARTAN  IN  MORNING.  Marland Kitchen losartan (COZAAR) 100 MG tablet TAKE ONE TABLET BY MOUTH IN MORNING WITH HCTZ.  . metFORMIN (GLUCOPHAGE) 1000 MG tablet TAKE 1 TABLET BY MOUTH TWICE DAILY WITH  A  MEAL.   Allergies  Allergen Reactions  . Lisinopril Cough     Review of Systems    Objective:   BP (!) 150/82 (BP Location: Left Arm, Patient Position: Sitting, Cuff Size: Normal)   Pulse 70   Resp 12   Ht '4\' 9"'$  (1.448 m)   Wt 152 lb (68.9 kg)   LMP  (LMP Unknown)   BMI 32.89 kg/m   Physical Exam Great toenails with 3/4 of nail normal, but distal edge white and  crumbling.   Skin of feet appears normal  Assessment & Plan  1.  Toenail onychomycosis and tinea pedis:  Appears improved, but with inconsistent use of Terbinafine, not sure if this will clear.  No further treatment for now. If toenails start looking worse and would  like retreatment, to let clinic know.  2.  Hypertension:  Discussed at length treatment vs cure and need to never miss her meds unless specifically told to stop by a provider.  To call if she has problems with a med. Return in 1 month for recheck with me on medication.  3.  DM:  As above regarding medication.  4.  Elevated liver enzymes:  Recheck as has finished Terbinafine.  Likely due to fatty liver.  5.  LDL not at goal:  Will need to address at followup--will see if she is able to get back on meds and lifestyle changes.

## 2019-04-18 LAB — HEPATIC FUNCTION PANEL
ALT: 122 IU/L — ABNORMAL HIGH (ref 0–32)
AST: 90 IU/L — ABNORMAL HIGH (ref 0–40)
Albumin: 4.2 g/dL (ref 3.8–4.8)
Alkaline Phosphatase: 118 IU/L — ABNORMAL HIGH (ref 39–117)
Bilirubin Total: 0.3 mg/dL (ref 0.0–1.2)
Bilirubin, Direct: 0.1 mg/dL (ref 0.00–0.40)
Total Protein: 7.6 g/dL (ref 6.0–8.5)

## 2019-04-23 ENCOUNTER — Other Ambulatory Visit: Payer: Self-pay

## 2019-04-23 DIAGNOSIS — R748 Abnormal levels of other serum enzymes: Secondary | ICD-10-CM

## 2019-04-24 LAB — HEPATITIS C ANTIBODY: Hep C Virus Ab: <0.1 s/co ratio (ref 0.0–0.9)

## 2019-04-24 LAB — HEPATITIS B SURFACE ANTIBODY,QUALITATIVE: Hep B Surface Ab, Qual: NONREACTIVE

## 2019-04-24 LAB — HEPATITIS B SURFACE ANTIGEN: Hepatitis B Surface Ag: NEGATIVE

## 2019-04-24 LAB — HEPATITIS A ANTIBODY, TOTAL: hep A Total Ab: POSITIVE — AB

## 2019-04-24 LAB — HEPATITIS B CORE ANTIBODY, TOTAL: Hep B Core Total Ab: NEGATIVE

## 2019-05-22 ENCOUNTER — Encounter: Payer: Self-pay | Admitting: Internal Medicine

## 2019-05-22 ENCOUNTER — Ambulatory Visit: Payer: Self-pay | Admitting: Internal Medicine

## 2019-05-22 ENCOUNTER — Other Ambulatory Visit: Payer: Self-pay

## 2019-05-22 VITALS — BP 138/88 | HR 82 | Resp 12 | Ht <= 58 in | Wt 152.0 lb

## 2019-05-22 DIAGNOSIS — Z23 Encounter for immunization: Secondary | ICD-10-CM

## 2019-05-22 DIAGNOSIS — E119 Type 2 diabetes mellitus without complications: Secondary | ICD-10-CM

## 2019-05-22 DIAGNOSIS — B351 Tinea unguium: Secondary | ICD-10-CM

## 2019-05-22 DIAGNOSIS — I10 Essential (primary) hypertension: Secondary | ICD-10-CM

## 2019-05-22 NOTE — Progress Notes (Signed)
    Subjective:    Patient ID: Tamara Skinner, female   DOB: November 07, 1970, 47 y.o.   MRN: [id number]   HPI   Interpreted by Irving Shows  1.  Hypertension:  BP much improved.  She is walking daily for 30 minutes.  Eating more fruits and vegetables.   Weight is stable at 152# from last visit.   2.  DM:  Has been checking sugars regularly.  Getting sugars 110-130 fasting in the morning. A1C in January was 6.6%, at goal.   Has not had an eye exam, though ordered in January.  Needs Pneumococcal 23 v  3.  HM:  Has not had mammogram ordered in January.  She admits to not having voicemail.  She screens calls as well.    4.  Toenail onychomycosis:  States toenail continues to grow out with thinner abnormal nail at distal end only--mainly right great toenail.   Current Meds  Medication Sig  . AGAMATRIX ULTRA-THIN LANCETS MISC Check blood glucose twice weekly before breakfast and twice weekly before dinner.  Marland Kitchen amLODipine (NORVASC) 10 MG tablet 1 tab by mouth daily  . aspirin 325 MG tablet Take 325 mg by mouth every 6 (six) hours as needed.  . Blood Glucose Monitoring Suppl (AGAMATRIX PRESTO) w/Device KIT Check blood glucose in the morning before breakfast and before dinner each twice weekly  . glucose blood (AGAMATRIX PRESTO TEST) test strip Check blood glucose twice weekly before breakfast and twice weekly before dinner  . hydrochlorothiazide (HYDRODIURIL) 25 MG tablet TAKE 1 TABLET BY MOUTH ONCE DAILY WTIH  LOSARTAN  IN  MORNING.  Marland Kitchen losartan (COZAAR) 100 MG tablet TAKE ONE TABLET BY MOUTH IN MORNING WITH HCTZ.  . metFORMIN (GLUCOPHAGE) 1000 MG tablet TAKE 1 TABLET BY MOUTH TWICE DAILY WITH  A  MEAL.   Allergies  Allergen Reactions  . Lisinopril Cough     Review of Systems    Objective:   BP 138/88 (BP Location: Left Arm, Cuff Size: Normal)   Pulse 82   Resp 12   Ht '4\' 9"'$  (1.448 m)   Wt 152 lb (68.9 kg)   BMI 32.89 kg/m   Physical Exam  NAD HEENT:  PERRL, EOMI Neck:  Supple, No  adenopathy Chest:  CTA CV:  RRR without murmur or rub.  Radial and DP pulses normal and equal. LE:  No edema Toenails:  Fungal damage just at distal aspect of great toenails.     Assessment & Plan  1.  Hypertension:  Better control.  CPM with medication and lifestyle changes.  Goal to be 120/70 range.  2.  DM:  A1C to confim what she describes and what has been good control.    3.  HM:  Pneumococcal vaccine.  Patient to call for mammogram--bre[facility].

## 2019-05-23 LAB — HGB A1C W/O EAG: Hgb A1c MFr Bld: 7.2 % — ABNORMAL HIGH (ref 4.8–5.6)

## 2019-06-06 ENCOUNTER — Other Ambulatory Visit: Payer: Self-pay

## 2019-06-06 DIAGNOSIS — R748 Abnormal levels of other serum enzymes: Secondary | ICD-10-CM

## 2019-06-07 LAB — HEPATIC FUNCTION PANEL
ALT: 87 IU/L — ABNORMAL HIGH (ref 0–32)
AST: 66 IU/L — ABNORMAL HIGH (ref 0–40)
Albumin: 4.2 g/dL (ref 3.8–4.8)
Alkaline Phosphatase: 109 IU/L (ref 39–117)
Bilirubin Total: 0.2 mg/dL (ref 0.0–1.2)
Bilirubin, Direct: 0.09 mg/dL (ref 0.00–0.40)
Total Protein: 7.1 g/dL (ref 6.0–8.5)

## 2019-08-28 ENCOUNTER — Encounter: Payer: Self-pay | Admitting: Internal Medicine

## 2019-08-28 ENCOUNTER — Other Ambulatory Visit: Payer: Self-pay

## 2019-08-28 ENCOUNTER — Ambulatory Visit: Payer: Self-pay | Admitting: Internal Medicine

## 2019-08-28 VITALS — BP 130/88 | HR 84 | Resp 12 | Ht <= 58 in | Wt 145.0 lb

## 2019-08-28 DIAGNOSIS — R748 Abnormal levels of other serum enzymes: Secondary | ICD-10-CM

## 2019-08-28 DIAGNOSIS — Z23 Encounter for immunization: Secondary | ICD-10-CM

## 2019-08-28 DIAGNOSIS — R809 Proteinuria, unspecified: Secondary | ICD-10-CM

## 2019-08-28 DIAGNOSIS — I10 Essential (primary) hypertension: Secondary | ICD-10-CM

## 2019-08-28 DIAGNOSIS — Z1231 Encounter for screening mammogram for malignant neoplasm of breast: Secondary | ICD-10-CM

## 2019-08-28 DIAGNOSIS — E7800 Pure hypercholesterolemia, unspecified: Secondary | ICD-10-CM

## 2019-08-28 DIAGNOSIS — E119 Type 2 diabetes mellitus without complications: Secondary | ICD-10-CM

## 2019-08-28 NOTE — Progress Notes (Signed)
    Subjective:    Patient ID: Tamara Skinner, female   DOB: May 31, 1971, 48 y.o.   MRN: [id number]   HPI  Interpreted  1.  Hypertension:  Has lost 7 lbs since July.  She has been more active.  Taking care of granddaughter and working with [employer]. Continues on Amlodipine, Losartan and HCTZ.  2.  DM:  Sugars in morning 138.  When checks in afternoon, generally 120-140.   Has not had eye exam his year.   3.  HM:  Still has not had mammogram.  Having problems keeping phone costs up and so may not have a working phone at times.    4.  Elevated Liver enzymes:  Plan to have come back for fasting labs.  5.  Mildly elevated LDL at 86 in January.   6.  Toenail onychomycosis and tinea pedis:  Clear still.   Current Meds  Medication Sig  . AGAMATRIX ULTRA-THIN LANCETS MISC Check blood glucose twice weekly before breakfast and twice weekly before dinner.  Marland Kitchen amLODipine (NORVASC) 10 MG tablet 1 tab by mouth daily  . aspirin 325 MG tablet Take 325 mg by mouth every 6 (six) hours as needed.  . Blood Glucose Monitoring Suppl (AGAMATRIX PRESTO) w/Device KIT Check blood glucose in the morning before breakfast and before dinner each twice weekly  . glucose blood (AGAMATRIX PRESTO TEST) test strip Check blood glucose twice weekly before breakfast and twice weekly before dinner  . hydrochlorothiazide (HYDRODIURIL) 25 MG tablet TAKE 1 TABLET BY MOUTH ONCE DAILY WTIH  LOSARTAN  IN  MORNING.  Marland Kitchen losartan (COZAAR) 100 MG tablet TAKE ONE TABLET BY MOUTH IN MORNING WITH HCTZ.  . metFORMIN (GLUCOPHAGE) 1000 MG tablet TAKE 1 TABLET BY MOUTH TWICE DAILY WITH  A  MEAL.   Allergies  Allergen Reactions  . Lisinopril Cough     Review of Systems    Objective:   BP 140/90 (BP Location: Left Arm, Patient Position: Sitting, Cuff Size: Normal)   Pulse 84   Resp 12   Ht '4\' 9"'$  (1.448 m)   Wt 145 lb (65.8 kg)   BMI 31.38 kg/m   Physical Exam NAD HEENT:  PERRL, EOMI, TMs pearly gray. Neck:  Supple, No  adenopathy, no thyromegaly Chest:  CTA CV:  RRR without murmur or rub.  Radial and DP pulses normal and equal. Abd:  S, NT, No HSM or mass, + BS LE:  No edema  Diabetic foot exam was performed with the following findings:   No deformities, ulcerations, or other skin breakdown Normal sensation of 10g monofilament Intact posterior tibialis and dorsalis pedis pulses Toenails clear     Assessment & Plan   1.  Hypertension:  Repeat bp at 130/88, a bit lower, but would like to see in 120/70 range.  Continue meds and lifestyle changes for weight loss.  2.  DM with microalbuminuria:  Sugars sound good.  A1C and urine Microalbumin/crea with fasting labs in 1-2 weeks.   Optometry referral with [facility].  3.  HM:  Influenza vaccine.  Reorder mammogram.  Patient to make sure voicemail available.  4.  Elevated LDL:  FLP in 1-2 weeks.  5.  Elevated liver enzymes:  Recheck with fasting labs.  Followup in 5 months

## 2019-09-02 ENCOUNTER — Other Ambulatory Visit: Payer: Self-pay

## 2019-09-02 ENCOUNTER — Other Ambulatory Visit (INDEPENDENT_AMBULATORY_CARE_PROVIDER_SITE_OTHER): Payer: Self-pay

## 2019-09-02 DIAGNOSIS — E119 Type 2 diabetes mellitus without complications: Secondary | ICD-10-CM

## 2019-09-02 DIAGNOSIS — Z79899 Other long term (current) drug therapy: Secondary | ICD-10-CM

## 2019-09-02 DIAGNOSIS — E7800 Pure hypercholesterolemia, unspecified: Secondary | ICD-10-CM

## 2019-09-03 LAB — LIPID PANEL W/O CHOL/HDL RATIO
Cholesterol, Total: 170 mg/dL (ref 100–199)
HDL: 52 mg/dL (ref >39)
LDL Chol Calc (NIH): 96 mg/dL (ref 0–99)
Triglycerides: 125 mg/dL (ref 0–149)
VLDL Cholesterol Cal: 22 mg/dL (ref 5–40)

## 2019-09-03 LAB — HEPATIC FUNCTION PANEL
ALT: 72 IU/L — ABNORMAL HIGH (ref 0–32)
AST: 49 IU/L — ABNORMAL HIGH (ref 0–40)
Albumin: 4.1 g/dL (ref 3.8–4.8)
Alkaline Phosphatase: 133 IU/L — ABNORMAL HIGH (ref 39–117)
Bilirubin Total: 0.4 mg/dL (ref 0.0–1.2)
Bilirubin, Direct: 0.13 mg/dL (ref 0.00–0.40)
Total Protein: 7.4 g/dL (ref 6.0–8.5)

## 2019-09-03 LAB — HGB A1C W/O EAG: Hgb A1c MFr Bld: 7.3 % — ABNORMAL HIGH (ref 4.8–5.6)

## 2019-09-17 MED ORDER — SIMVASTATIN 20 MG PO TABS: ORAL_TABLET | ORAL | 11 refills | Status: DC

## 2019-09-17 NOTE — Addendum Note (Signed)
Addended by: Marcelino Duster on: 09/17/2019 11:08 AM   Modules accepted: Orders

## 2019-10-10 ENCOUNTER — Other Ambulatory Visit: Payer: Self-pay | Admitting: Internal Medicine

## 2019-10-10 DIAGNOSIS — Z1231 Encounter for screening mammogram for malignant neoplasm of breast: Secondary | ICD-10-CM

## 2019-10-30 ENCOUNTER — Other Ambulatory Visit: Payer: Self-pay

## 2019-10-30 ENCOUNTER — Other Ambulatory Visit (INDEPENDENT_AMBULATORY_CARE_PROVIDER_SITE_OTHER): Payer: Self-pay

## 2019-10-30 DIAGNOSIS — R748 Abnormal levels of other serum enzymes: Secondary | ICD-10-CM

## 2019-10-30 DIAGNOSIS — E7800 Pure hypercholesterolemia, unspecified: Secondary | ICD-10-CM

## 2019-10-31 LAB — HEPATIC FUNCTION PANEL
ALT: 49 IU/L — ABNORMAL HIGH (ref 0–32)
AST: 42 IU/L — ABNORMAL HIGH (ref 0–40)
Albumin: 4 g/dL (ref 3.8–4.8)
Alkaline Phosphatase: 125 IU/L — ABNORMAL HIGH (ref 39–117)
Bilirubin Total: 0.4 mg/dL (ref 0.0–1.2)
Bilirubin, Direct: 0.11 mg/dL (ref 0.00–0.40)
Total Protein: 7.6 g/dL (ref 6.0–8.5)

## 2019-10-31 LAB — LIPID PANEL W/O CHOL/HDL RATIO
Cholesterol, Total: 131 mg/dL (ref 100–199)
HDL: 44 mg/dL (ref >39)
LDL Chol Calc (NIH): 67 mg/dL (ref 0–99)
Triglycerides: 108 mg/dL (ref 0–149)
VLDL Cholesterol Cal: 20 mg/dL (ref 5–40)

## 2019-11-15 ENCOUNTER — Other Ambulatory Visit: Payer: Self-pay | Admitting: Internal Medicine

## 2019-11-18 ENCOUNTER — Other Ambulatory Visit: Payer: Self-pay | Admitting: Internal Medicine

## 2019-11-20 ENCOUNTER — Encounter: Payer: Self-pay | Admitting: Internal Medicine

## 2019-11-20 ENCOUNTER — Other Ambulatory Visit: Payer: Self-pay | Admitting: Internal Medicine

## 2019-11-20 ENCOUNTER — Other Ambulatory Visit: Payer: Self-pay

## 2019-11-20 ENCOUNTER — Ambulatory Visit: Payer: Self-pay | Admitting: Internal Medicine

## 2019-11-20 VITALS — BP 138/82 | HR 72 | Resp 12 | Ht <= 58 in | Wt 142.0 lb

## 2019-11-20 DIAGNOSIS — E7800 Pure hypercholesterolemia, unspecified: Secondary | ICD-10-CM

## 2019-11-20 DIAGNOSIS — Z124 Encounter for screening for malignant neoplasm of cervix: Secondary | ICD-10-CM

## 2019-11-20 DIAGNOSIS — I4949 Other premature depolarization: Secondary | ICD-10-CM

## 2019-11-20 DIAGNOSIS — I10 Essential (primary) hypertension: Secondary | ICD-10-CM

## 2019-11-20 DIAGNOSIS — K029 Dental caries, unspecified: Secondary | ICD-10-CM

## 2019-11-20 DIAGNOSIS — E119 Type 2 diabetes mellitus without complications: Secondary | ICD-10-CM

## 2019-11-20 DIAGNOSIS — R011 Cardiac murmur, unspecified: Secondary | ICD-10-CM

## 2019-11-20 DIAGNOSIS — R748 Abnormal levels of other serum enzymes: Secondary | ICD-10-CM

## 2019-11-20 DIAGNOSIS — R9431 Abnormal electrocardiogram [ECG] [EKG]: Secondary | ICD-10-CM

## 2019-11-20 DIAGNOSIS — E782 Mixed hyperlipidemia: Secondary | ICD-10-CM

## 2019-11-20 DIAGNOSIS — Z0000 Encounter for general adult medical examination without abnormal findings: Secondary | ICD-10-CM

## 2019-11-20 MED ORDER — AGAMATRIX PRESTO TEST VI STRP: ORAL_STRIP | 11 refills | Status: DC

## 2019-11-20 MED ORDER — AGAMATRIX ULTRA-THIN LANCETS MISC: 11 refills | Status: DC

## 2019-11-20 MED ORDER — AMLODIPINE BESYLATE 10 MG PO TABS: ORAL_TABLET | ORAL | 11 refills | Status: DC

## 2019-11-20 NOTE — Progress Notes (Signed)
Subjective:    Patient ID: Tamara Skinner, female   DOB: May 04, 1971, 49 y.o.   MRN: [id number]   HPI   CPE with pap  1.  Pap: Last pap smear 3 years ago and normal.     2.  Mammogram:  Never.  She does have an appt 12/04/2019.  No family history of breast cancer.   3.  Osteoprevention:  Has at least 3 servings of milk and cheese daily.   Walks 20-30 minutes daily when not cold outside.    4.  Guaiac Cards:  Performed about 1 year ago and negative.  5.  Colonoscopy:  Never.  No family history of colon cancer.  6.  Immunizations:   Immunization History  Administered Date(s) Administered  . Influenza Inj Mdck Quad Pf 08/28/2019  . Influenza,inj,Quad PF,6+ Mos 08/28/2017  . Influenza-Unspecified 08/07/2018  . Pneumococcal Conjugate-13 08/28/2017  . Pneumococcal Polysaccharide-23 05/22/2019  . Tdap 08/28/2017   Discussed COVID 19 vaccine hopefully in near future.  7.  Glucose/Cholesterol:  Last A1C in November 7.3%, which is up from earlier in year.  Sugars running low 100s.   Has not had an eye check in past year.  Recent cholesterol at goal on Simvastatin, but unfortunately stopped after finishing first refill as did not understand she was to continue: Lipid Panel     Component Value Date/Time   CHOL 131 10/30/2019 0918   TRIG 108 10/30/2019 0918   HDL 44 10/30/2019 0918   CHOLHDL 3.6 07/20/2017 1225   LDLCALC 67 10/30/2019 0918   LABVLDL 20 10/30/2019 0918     Current Meds  Medication Sig  . AGAMATRIX ULTRA-THIN LANCETS MISC Check blood glucose twice weekly before breakfast and twice weekly before dinner.  Marland Kitchen amLODipine (NORVASC) 10 MG tablet Take 1 tablet by mouth once daily  . aspirin 325 MG tablet Take 325 mg by mouth every 6 (six) hours as needed.  . Blood Glucose Monitoring Suppl (AGAMATRIX PRESTO) w/Device KIT Check blood glucose in the morning before breakfast and before dinner each twice weekly  . glucose blood (AGAMATRIX PRESTO TEST) test strip Check blood  glucose twice weekly before breakfast and twice weekly before dinner  . hydrochlorothiazide (HYDRODIURIL) 25 MG tablet TAKE 1 TABLET BY MOUTH ONCE DAILY WTIH  LOSARTAN  IN  MORNING.  Marland Kitchen losartan (COZAAR) 100 MG tablet TAKE ONE TABLET BY MOUTH IN MORNING WITH HCTZ.  . metFORMIN (GLUCOPHAGE) 1000 MG tablet TAKE 1 TABLET BY MOUTH TWICE DAILY WITH  A  MEAL.   Allergies  Allergen Reactions  . Lisinopril Cough   Past Medical History:  Diagnosis Date  . Diabetes mellitus without complication ([place]) 2633  . Hyperlipidemia 2020  . Hypertension 2016    Past Surgical History:  Procedure Laterality Date  . TUBAL LIGATION  1927    Family History  Problem Relation Age of Onset  . Diabetes Mother   . Hypertension Father   . Stroke Father        Three strokes  . Diabetes Sister   . Hypertension Sister   . Diabetes Brother   . Hypertension Brother   . Diabetes Brother   . Hypertension Brother     Social History   Socioeconomic History  . Marital status: Single    Spouse name: Not on file  . Number of children: 4  . Years of education: 42  . Highest education level: High school graduate  Occupational History  . Occupation: Roofing clean up  Tobacco Use  .  Smoking status: Never Smoker  . Smokeless tobacco: Never Used  Substance and Sexual Activity  . Alcohol use: No  . Drug use: No  . Sexual activity: Not Currently    Birth control/protection: Surgical  Other Topics Concern  . Not on file  Social History Narrative   Moving in with son here in [place] soon as can no longer afford to live alone after previously losing job.   She will be living with her son, his wife and his 2 children   Social Determinants of Health   Financial Resource Strain: Low Risk   . Difficulty of Paying Living Expenses: Not very hard  Food Insecurity: No Food Insecurity  . Worried About Charity fundraiser in the Last Year: Never true  . Ran Out of Food in the Last Year: Never true    Transportation Needs: No Transportation Needs  . Lack of Transportation (Medical): No  . Lack of Transportation (Non-Medical): No  Physical Activity: Insufficiently Active  . Days of Exercise per Week: 7 days  . Minutes of Exercise per Session: 20 min  Stress:   . Feeling of Stress :   Social Connections:   . Frequency of Communication with Friends and Family:   . Frequency of Social Gatherings with Friends and Family:   . Attends Religious Services:   . Active Member of Clubs or Organizations:   . Attends Archivist Meetings:   Marland Kitchen Marital Status:   Intimate Partner Violence: Not At Risk  . Fear of Current or Ex-Partner: No  . Emotionally Abused: No  . Physically Abused: No  . Sexually Abused: No     Review of Systems  Constitutional: Negative for appetite change and fatigue.  HENT: Positive for dental problem (Molar is loose.). Negative for ear pain, hearing loss and rhinorrhea.   Eyes: Negative for visual disturbance.  Respiratory: Negative for cough and shortness of breath.   Cardiovascular: Negative for chest pain, palpitations and leg swelling.  Gastrointestinal: Negative for abdominal pain, blood in stool (No melena), constipation and diarrhea.  Genitourinary: Negative for dysuria, menstrual problem (No periods any longer.) and vaginal discharge.  Musculoskeletal: Negative for arthralgias.  Skin: Negative for rash.  Neurological: Negative for weakness and numbness.  Psychiatric/Behavioral: Negative for dysphoric mood. The patient is not nervous/anxious.       Objective:   BP 138/82 (BP Location: Left Arm, Patient Position: Sitting, Cuff Size: Normal)   Pulse 72   Resp 12   Ht _0  (1.448 m)   Wt 142 lb (64.4 kg)   BMI 30.73 kg/m   Physical Exam  Constitutional: She is oriented to person, place, and time. She appears well-developed and well-nourished.  HENT:  Head: Normocephalic and atraumatic.  Right Ear: Tympanic membrane, external ear and ear  canal normal.  Left Ear: Tympanic membrane, external ear and ear canal normal.  Nose: Nose normal.  Mouth/Throat: Uvula is midline, oropharynx is clear and moist and mucous membranes are normal.  Loose molar  Eyes: Pupils are equal, round, and reactive to light. Conjunctivae and EOM are normal.  Neck: No thyromegaly present.  Cardiovascular: Normal rate, regular rhythm, S1 normal and S2 normal.  Occasional extrasystoles are present. Exam reveals no S3, no S4 and no friction rub.  Murmur (Soft SEM along LSB.  ) heard. No carotid bruits.  Carotid, radial, femoral, DP and PT pulses normal and equal.   Pulmonary/Chest: Effort normal and breath sounds normal. Right breast exhibits no inverted nipple, no  mass, no nipple discharge, no skin change and no tenderness. Left breast exhibits no inverted nipple, no mass, no nipple discharge, no skin change and no tenderness.  Abdominal: Soft. Bowel sounds are normal. She exhibits no mass. There is no hepatosplenomegaly. There is no abdominal tenderness. No hernia.  Genitourinary:    Genitourinary Comments: Normal external female genitalia No vaginal discharge.  No cervical or vaginal mucosal lesions. No uterine or adnexal mass or tenderness.   Musculoskeletal:        General: Normal range of motion.     Cervical back: Full passive range of motion without pain, normal range of motion and neck supple.  Lymphadenopathy:       Head (right side): No submental and no submandibular adenopathy present.       Head (left side): No submental and no submandibular adenopathy present.    She has no cervical adenopathy.    She has no axillary adenopathy.       Right: No inguinal and no supraclavicular adenopathy present.       Left: No inguinal and no supraclavicular adenopathy present.  Neurological: She is alert and oriented to person, place, and time. She has normal strength and normal reflexes. No cranial nerve deficit or sensory deficit. Coordination and gait  normal.  Diabetic foot exam was performed with the following findings:   No deformities, ulcerations, or other skin breakdown Normal sensation of 10g monofilament Intact posterior tibialis and dorsalis pedis pulses     Skin: Skin is warm. No rash noted.  Psychiatric: She has a normal mood and affect. Her speech is normal and behavior is normal. Judgment and thought content normal.     Assessment & Plan  1.  CPE with pap Has mammogram appt in next couple of weeks. Guaiac cards x 3 to return in 2 weeks  2.  DM:  A1C with fasting labs in 6 weeks. Referral for eye exam with [facility]  3.  Hypertension:  Controlled on Amlodipine.  4.  Hyperlipidemia:  FLP with CMP in 6 weeks after back on Simvastatin.  5.  Loose tooth/dental decay:  Dental referral  6.  Perhaps a new heart murmur with extrasystoles, appear to be PACs on ECG today.  Referral to Cardiology

## 2019-11-21 ENCOUNTER — Ambulatory Visit (INDEPENDENT_AMBULATORY_CARE_PROVIDER_SITE_OTHER): Payer: No Typology Code available for payment source | Admitting: Cardiology

## 2019-11-21 ENCOUNTER — Encounter: Payer: Self-pay | Admitting: Cardiology

## 2019-11-21 VITALS — BP 134/82 | HR 92 | Ht <= 58 in | Wt 143.4 lb

## 2019-11-21 DIAGNOSIS — E119 Type 2 diabetes mellitus without complications: Secondary | ICD-10-CM

## 2019-11-21 DIAGNOSIS — R011 Cardiac murmur, unspecified: Secondary | ICD-10-CM

## 2019-11-21 DIAGNOSIS — E7800 Pure hypercholesterolemia, unspecified: Secondary | ICD-10-CM

## 2019-11-21 DIAGNOSIS — I10 Essential (primary) hypertension: Secondary | ICD-10-CM

## 2019-11-21 DIAGNOSIS — R002 Palpitations: Secondary | ICD-10-CM

## 2019-11-21 LAB — COMPREHENSIVE METABOLIC PANEL
ALT: 52 IU/L — ABNORMAL HIGH (ref 0–32)
AST: 46 IU/L — ABNORMAL HIGH (ref 0–40)
Albumin/Globulin Ratio: 1.1 — ABNORMAL LOW (ref 1.2–2.2)
Albumin: 4 g/dL (ref 3.8–4.8)
Alkaline Phosphatase: 139 IU/L — ABNORMAL HIGH (ref 39–117)
BUN/Creatinine Ratio: 17 (ref 9–23)
BUN: 14 mg/dL (ref 6–24)
Bilirubin Total: 0.4 mg/dL (ref 0.0–1.2)
CO2: 23 mmol/L (ref 20–29)
Calcium: 9.5 mg/dL (ref 8.7–10.2)
Chloride: 101 mmol/L (ref 96–106)
Creatinine, Ser: 0.83 mg/dL (ref 0.57–1.00)
GFR calc Af Amer: 95 mL/min/{1.73_m2} (ref >59)
GFR calc non Af Amer: 82 mL/min/{1.73_m2} (ref >59)
Globulin, Total: 3.7 g/dL (ref 1.5–4.5)
Glucose: 186 mg/dL — ABNORMAL HIGH (ref 65–99)
Potassium: 3.7 mmol/L (ref 3.5–5.2)
Sodium: 137 mmol/L (ref 134–144)
Total Protein: 7.7 g/dL (ref 6.0–8.5)

## 2019-11-21 LAB — CYTOLOGY - PAP

## 2019-11-21 NOTE — Patient Instructions (Signed)
Medication Instructions:  Your physician recommends that you continue on your current medications as directed. Please refer to the Current Medication list given to you today.  *If you need a refill on your cardiac medications before your next appointment, please call your pharmacy*  Testing/Procedures: Your physician has requested that you have an echocardiogram. Echocardiography is a painless test that uses sound waves to create images of your heart. It provides your doctor with information about the size and shape of your heart and how well your heart's chambers and valves are working. This procedure takes approximately one hour. There are no restrictions for this procedure.  Follow-Up: At [facility], you and your health needs are our priority.  As part of our continuing mission to provide you with exceptional heart care, we have created designated Provider Care Teams.  These Care Teams include your primary Cardiologist (physician) and Advanced Practice Providers (APPs -  Physician Assistants and Nurse Practitioners) who all work together to provide you with the care you need, when you need it.  Follow up with Dr. Mayford Knife as needed based on testing results.

## 2019-11-21 NOTE — Progress Notes (Signed)
Cardiology Office Consult Note    Date:  11/21/2019   ID:  Tamara Skinner, DOB [birth year], MRN [id number]  PCP:  Mack Hook, MD  Cardiologist:  Fransico Him, MD   Chief Complaint  Patient presents with  . New Patient (Initial Visit)    Heart murmur    History of Present Illness:  Tamara Skinner is a 49 y.o. female who is being seen today for the evaluation of heart murmur at the request of Mack Hook, MD.  This is a pleasant 49yo female with a hx of DM2, HLD and HTN who was recently seen for wellness exam and was noted to have a heart murmur on PE.  She is here today for followup and is doing well.  She denies any chest pain or pressure, SOB, DOE, PND, orthopnea, LE edema, dizziness, palpitations or syncope. She is compliant with her meds and is tolerating meds with no SE.   Past Medical History:  Diagnosis Date  . Diabetes mellitus without complication ([place]) 6948  . Hyperlipidemia 2020  . Hypertension 2016    Past Surgical History:  Procedure Laterality Date  . TUBAL LIGATION  1927    Current Medications: Current Meds  Medication Sig  . AgaMatrix Ultra-Thin Lancets MISC Check blood glucose twice weekly before breakfast and twice weekly before dinner.  Marland Kitchen amLODipine (NORVASC) 10 MG tablet Take 1 tablet by mouth once daily  . aspirin 325 MG tablet Take 325 mg by mouth every 6 (six) hours as needed.  . Blood Glucose Monitoring Suppl (AGAMATRIX PRESTO) w/Device KIT Check blood glucose in the morning before breakfast and before dinner each twice weekly  . glucose blood (AGAMATRIX PRESTO TEST) test strip Check blood glucose twice weekly before breakfast and twice weekly before dinner  . hydrochlorothiazide (HYDRODIURIL) 25 MG tablet TAKE 1 TABLET BY MOUTH ONCE DAILY WTIH  LOSARTAN  IN  MORNING.  Marland Kitchen losartan (COZAAR) 100 MG tablet TAKE ONE TABLET BY MOUTH IN MORNING WITH HCTZ.  . metFORMIN (GLUCOPHAGE) 1000 MG tablet TAKE 1 TABLET BY MOUTH TWICE DAILY WITH  A  MEAL.  .  simvastatin (ZOCOR) 20 MG tablet 1 tab by mouth daily with dinner    Allergies:   Lisinopril   Social History   Socioeconomic History  . Marital status: Single    Spouse name: Not on file  . Number of children: 4  . Years of education: 31  . Highest education level: 12th grade  Occupational History  . Occupation: Roofing clean up  Tobacco Use  . Smoking status: Never Smoker  . Smokeless tobacco: Never Used  Substance and Sexual Activity  . Alcohol use: No  . Drug use: No  . Sexual activity: Not Currently    Birth control/protection: Surgical  Other Topics Concern  . Not on file  Social History Narrative   Moving in with son here in [place] soon as can no longer afford to live alone after previously losing job.   She will be living with her son, his wife and his 2 children   Social Determinants of Health   Financial Resource Strain: Low Risk   . Difficulty of Paying Living Expenses: Not very hard  Food Insecurity: No Food Insecurity  . Worried About Charity fundraiser in the Last Year: Never true  . Ran Out of Food in the Last Year: Never true  Transportation Needs: No Transportation Needs  . Lack of Transportation (Medical): No  . Lack of Transportation (Non-Medical): No  Physical  Activity: Insufficiently Active  . Days of Exercise per Week: 7 days  . Minutes of Exercise per Session: 20 min  Stress: Stress Concern Present  . Feeling of Stress : Very much  Social Connections: Somewhat Isolated  . Frequency of Communication with Friends and Family: More than three times a week  . Frequency of Social Gatherings with Friends and Family: Twice a week  . Attends Religious Services: More than 4 times per year  . Active Member of Clubs or Organizations: No  . Attends Archivist Meetings: Never  . Marital Status: Never married     Family History:  The patient's family history includes Diabetes in her brother, brother, mother, and sister; Hypertension in her  brother, brother, father, and sister; Stroke in her father.   ROS:   Please see the history of present illness.    ROS All other systems reviewed and are negative.  No flowsheet data found.   PHYSICAL EXAM:   VS:  BP 134/82   Pulse 92   Ht '4\' 9"'  (1.448 m)   Wt 143 lb 6.4 oz (65 kg)   SpO2 98%   BMI 31.03 kg/m    GEN: Well nourished, well developed, in no acute distress  HEENT: normal  Neck: no JVD, carotid bruits, or masses Cardiac: RRR; no ubs, or gallops,no edema.  Intact distal pulses bilaterally. 1/6 SM at RUSB to LUSB Respiratory:  clear to auscultation bilaterally, normal work of breathing GI: soft, nontender, nondistended, + BS MS: no deformity or atrophy  Skin: warm and dry, no rash Neuro:  Alert and Oriented x 3, Strength and sensation are intact Psych: euthymic mood, full affect  Wt Readings from Last 3 Encounters:  11/21/19 143 lb 6.4 oz (65 kg)  11/20/19 142 lb (64.4 kg)  08/28/19 145 lb (65.8 kg)      Studies/Labs Reviewed:   EKG:  EKG is ordered today.  The ekg ordered today demonstrates NSR with normal intervals  Recent Labs: 11/20/2019: ALT 52; BUN 14; Creatinine, Ser 0.83; Potassium 3.7; Sodium 137   Lipid Panel    Component Value Date/Time   CHOL 131 10/30/2019 0918   TRIG 108 10/30/2019 0918   HDL 44 10/30/2019 0918   CHOLHDL 3.6 07/20/2017 1225   LDLCALC 67 10/30/2019 0918    Additional studies/ records that were reviewed today include:  Office notes of PCP, EKG    ASSESSMENT:    1. Heart murmur   2. Essential hypertension   3. Pure hypercholesterolemia   4. DM type 2, goal HbA1c < 7% (HCC)   5. Palpitations      PLAN:  In order of problems listed above:  1. Heart Murmur -she has a systolic murmur over the RUSB -I will get a 2D echo to assess  2.  HTN -BP controlled -continue amlodipine 8m daily, HCTZ 252mdaily, Losartan 10071maily  3.  HLD -LDL goal < 70 with DM -LDL was 67 by labs yesterday -continue simvastatin  73m59mily -per PCP  4.  Type 2 DM -followed by PCP  -HbA1C 7.3% -continue metformin 1000mg65m  5.  Palpitations -she describes occasional skipped beats lasting a few seconds -EKG in PCP office showed PACs  Medication Adjustments/Labs and Tests Ordered: Current medicines are reviewed at length with the patient today.  Concerns regarding medicines are outlined above.  Medication changes, Labs and Tests ordered today are listed in the Patient Instructions below.  There are no Patient Instructions on file for  this visit.   Signed, Fransico Him, MD  11/21/2019 8:58 AM    [address], [place], [place]  [address] Phone: [phone]; Fax: [phone]

## 2019-11-24 NOTE — Addendum Note (Signed)
Addended by: Theresia Majors on: 11/24/2019 01:43 PM   Modules accepted: Orders

## 2019-12-04 ENCOUNTER — Other Ambulatory Visit: Payer: Self-pay

## 2019-12-04 ENCOUNTER — Ambulatory Visit
Admission: RE | Admit: 2019-12-04 | Discharge: 2019-12-04 | Disposition: A | Discharge status: Home / Self Care | Payer: No Typology Code available for payment source | Source: Ambulatory Visit | Attending: Internal Medicine | Admitting: Internal Medicine

## 2019-12-04 DIAGNOSIS — Z1231 Encounter for screening mammogram for malignant neoplasm of breast: Secondary | ICD-10-CM

## 2019-12-08 ENCOUNTER — Ambulatory Visit (HOSPITAL_COMMUNITY): Payer: No Typology Code available for payment source | Attending: Cardiovascular Disease

## 2019-12-08 ENCOUNTER — Other Ambulatory Visit (HOSPITAL_COMMUNITY): Payer: Self-pay

## 2019-12-08 ENCOUNTER — Other Ambulatory Visit: Payer: Self-pay | Admitting: Internal Medicine

## 2019-12-08 ENCOUNTER — Other Ambulatory Visit: Payer: Self-pay

## 2019-12-08 DIAGNOSIS — R011 Cardiac murmur, unspecified: Secondary | ICD-10-CM

## 2019-12-08 DIAGNOSIS — R928 Other abnormal and inconclusive findings on diagnostic imaging of breast: Secondary | ICD-10-CM

## 2019-12-16 ENCOUNTER — Ambulatory Visit: Payer: Self-pay | Admitting: Medical

## 2019-12-16 ENCOUNTER — Ambulatory Visit
Admission: RE | Admit: 2019-12-16 | Discharge: 2019-12-16 | Disposition: A | Discharge status: Home / Self Care | Payer: No Typology Code available for payment source | Source: Ambulatory Visit | Attending: Internal Medicine | Admitting: Internal Medicine

## 2019-12-16 ENCOUNTER — Other Ambulatory Visit: Payer: Self-pay

## 2019-12-16 ENCOUNTER — Ambulatory Visit: Payer: No Typology Code available for payment source

## 2019-12-16 VITALS — BP 148/98 | Wt 146.0 lb

## 2019-12-16 DIAGNOSIS — N6489 Other specified disorders of breast: Secondary | ICD-10-CM

## 2019-12-16 DIAGNOSIS — R928 Other abnormal and inconclusive findings on diagnostic imaging of breast: Secondary | ICD-10-CM

## 2019-12-16 DIAGNOSIS — Z01419 Encounter for gynecological examination (general) (routine) without abnormal findings: Secondary | ICD-10-CM

## 2019-12-16 NOTE — Progress Notes (Signed)
[name] is a 49 y.o. female who presents to [facility] clinic today with no complaints.    Pap Smear: Pap smear not completed today. Last Pap smear was 11/20/2019 at [facility] and was normal. Per patient has no history of an abnormal Pap smear. Last Pap smear result is available in Epic.   Physical exam: Breasts Breasts symmetrical. No skin abnormalities bilateral breasts. No nipple retraction bilateral breasts. No nipple discharge bilateral breasts. No lymphadenopathy. No lumps palpated bilateral breasts.       Pelvic/Bimanual Pap is not indicated today    Smoking History: Patient has never smoked.     Patient Navigation: Patient education provided. Access to services provided for patient through [facility] program. Interpreter provided.    Colorectal Cancer Screening: Per patient has never had colonoscopy completed No complaints today.    Breast and Cervical Cancer Risk Assessment: Patient does not have family history of breast cancer, known genetic mutations, or radiation treatment to the chest before age 89. Patient does not have history of cervical dysplasia, immunocompromised, or DES exposure in-utero. Patient had screening mammogram on 12/04/19 with results showing possible left breast asymmetry. She will have a diagnostic mammogram and possible ultrasound of the left breast today.   Risk Assessment    Risk Scores      12/16/2019   Last edited by: Narda Rutherford, LPN   5-year risk: 0.5 %   Lifetime risk: 4.6 %          A: B[facility]CP exam without pap smear  P: [facility]erred patient to [facility] for a diagnostic mammogram. Appointment scheduled 12/16/19 at 1450.  Marny Lowenstein, PA-C 12/16/2019 12:49 PM

## 2019-12-16 NOTE — Patient Instructions (Signed)
Mamografa Mammogram Una mamografa es un radiografa de las mamas que se realiza para determinar si hay cambios que no son normales. Este estudio permite explorar y encontrar cualquier cambio que pudiera sugerir la presencia de cncer de mama. Las mamografas se realizan peridicamente en las mujeres. Un hombre puede hacerse una mamografa si tiene un bulto o hinchazn en la mama. Tambin puede ayudar a identificar otros cambios y variaciones en las mamas. Informe al mdico:  Acerca de cualquier alergia que tenga.  Si tiene implantes mamarios.  Si ha tenido enfermedades, biopsias o cirugas previas de la mama.  Si est amamantando.  Si es menor de 25aos.  Si tiene antecedentes familiares de cncer de mama.  Si est embarazada o podra estarlo. Cules son los riesgos? En general, se trata de un procedimiento seguro. Sin embargo, pueden ocurrir complicaciones, por ejemplo:  Exposicin a la radiacin. En este estudio, los niveles de radiacin son muy bajos.  La interpretacin errnea de los resultados.  La necesidad de realizar ms estudios.  La imposibilidad de la mamografa de detectar algunos tipos de cncer. Qu ocurre antes del procedimiento?  Hgase este estudio aproximadamente 1 o 2semanas despus de la menstruacin. Generalmente, este es el momento en que las mamas estn menos sensibles.  Si consulta a un mdico nuevo o cambia de clnica, enve las mamografas anteriores al nuevo consultorio.  El da del estudio, lvese las mamas y las axilas.  No use desodorantes, perfumes, lociones o talcos el da del estudio.  Qutese las alhajas del cuello.  Use prendas que pueda ponerse y sacarse fcilmente. Qu ocurre durante el procedimiento?   Se quitar la ropa de la cintura para arriba. Se colocar una bata.  Debe permanecer de pie delante de la mquina de rayos-X.  Se colocar cada mama entre dos placas de vidrio o de plstico. Las placas comprimirn las mamas  durante unos segundos. Intente estar lo ms relajada posible. Esto no causa ningn dao a las mamas. Si siente alguna molestia, ser pasajera.  Se tomarn radiografas desde diferentes ngulos de cada mama. Este procedimiento puede variar segn el mdico y el hospital. Qu ocurre despus del procedimiento?  La mamografa ser leda por un especialista (radilogo).  Tal vez deba repetir algunas partes del estudio. Esto depende de la calidad de las imgenes.  Pregunte la fecha en que los resultados estarn disponibles. Asegrese de obtener los resultados.  Puede volver a sus actividades habituales. Resumen  Una mamografa es un radiografa de baja energa de las mamas que se realiza para determinar si hay cambios anormales. Un hombre puede hacerse este examen si tiene un bulto o hinchazn en la mama.  Antes del procedimiento, informe al mdico sobre cualquier problema en las mamas que haya tenido en el pasado.  Hgase este estudio aproximadamente 1 o 2semanas despus de la menstruacin.  Para el examen, se colocar cada mama entre dos placas de vidrio o de plstico. Las placas comprimirn las mamas durante unos segundos.  La mamografa ser leda por un especialista (radilogo). Pregunte la fecha en que los resultados estarn disponibles. Asegrese de obtener los resultados. Esta informacin no tiene como fin reemplazar el consejo del mdico. Asegrese de hacerle al mdico cualquier pregunta que tenga. Document Revised: 07/16/2018 Document Reviewed: 07/16/2018 Elsevier Patient Education  2020 [employer]  

## 2020-01-01 ENCOUNTER — Other Ambulatory Visit (INDEPENDENT_AMBULATORY_CARE_PROVIDER_SITE_OTHER): Payer: Self-pay

## 2020-01-01 DIAGNOSIS — E119 Type 2 diabetes mellitus without complications: Secondary | ICD-10-CM

## 2020-01-01 DIAGNOSIS — E7800 Pure hypercholesterolemia, unspecified: Secondary | ICD-10-CM

## 2020-01-01 DIAGNOSIS — Z1211 Encounter for screening for malignant neoplasm of colon: Secondary | ICD-10-CM

## 2020-01-01 DIAGNOSIS — Z79899 Other long term (current) drug therapy: Secondary | ICD-10-CM

## 2020-01-01 LAB — POC HEMOCCULT BLD/STL (HOME/3-CARD/SCREEN)
Card #2 Fecal Occult Blod, POC: NEGATIVE
Card #3 Fecal Occult Blood, POC: NEGATIVE
Fecal Occult Blood, POC: NEGATIVE

## 2020-01-02 LAB — COMPREHENSIVE METABOLIC PANEL
ALT: 44 IU/L — ABNORMAL HIGH (ref 0–32)
AST: 39 IU/L (ref 0–40)
Albumin/Globulin Ratio: 1.1 — ABNORMAL LOW (ref 1.2–2.2)
Albumin: 4 g/dL (ref 3.8–4.8)
Alkaline Phosphatase: 125 IU/L — ABNORMAL HIGH (ref 39–117)
BUN/Creatinine Ratio: 18 (ref 9–23)
BUN: 15 mg/dL (ref 6–24)
Bilirubin Total: 0.3 mg/dL (ref 0.0–1.2)
CO2: 22 mmol/L (ref 20–29)
Calcium: 9.7 mg/dL (ref 8.7–10.2)
Chloride: 100 mmol/L (ref 96–106)
Creatinine, Ser: 0.82 mg/dL (ref 0.57–1.00)
GFR calc Af Amer: 96 mL/min/{1.73_m2} (ref >59)
GFR calc non Af Amer: 84 mL/min/{1.73_m2} (ref >59)
Globulin, Total: 3.6 g/dL (ref 1.5–4.5)
Glucose: 163 mg/dL — ABNORMAL HIGH (ref 65–99)
Potassium: 4.5 mmol/L (ref 3.5–5.2)
Sodium: 138 mmol/L (ref 134–144)
Total Protein: 7.6 g/dL (ref 6.0–8.5)

## 2020-01-02 LAB — LIPID PANEL W/O CHOL/HDL RATIO
Cholesterol, Total: 123 mg/dL (ref 100–199)
HDL: 48 mg/dL (ref >39)
LDL Chol Calc (NIH): 50 mg/dL (ref 0–99)
Triglycerides: 144 mg/dL (ref 0–149)
VLDL Cholesterol Cal: 25 mg/dL (ref 5–40)

## 2020-01-02 LAB — HGB A1C W/O EAG: Hgb A1c MFr Bld: 7.1 % — ABNORMAL HIGH (ref 4.8–5.6)

## 2020-01-06 ENCOUNTER — Ambulatory Visit: Payer: Self-pay | Admitting: Internal Medicine

## 2020-01-29 ENCOUNTER — Ambulatory Visit: Payer: Self-pay | Admitting: Internal Medicine

## 2020-01-29 ENCOUNTER — Encounter: Payer: Self-pay | Admitting: Internal Medicine

## 2020-01-29 VITALS — BP 138/90 | HR 90 | Temp 97.3°F | Resp 12 | Ht <= 58 in | Wt 148.0 lb

## 2020-01-29 DIAGNOSIS — R011 Cardiac murmur, unspecified: Secondary | ICD-10-CM

## 2020-01-29 DIAGNOSIS — E782 Mixed hyperlipidemia: Secondary | ICD-10-CM

## 2020-01-29 DIAGNOSIS — E119 Type 2 diabetes mellitus without complications: Secondary | ICD-10-CM

## 2020-01-29 DIAGNOSIS — I10 Essential (primary) hypertension: Secondary | ICD-10-CM

## 2020-01-29 NOTE — Progress Notes (Addendum)
    Subjective:    Patient ID: Tamara Skinner, female   DOB: Aug 23, 1971, 49 y.o.   MRN: [id number]   HPI   1.  DM:  Recently, A1C resulted at 7.1%.  Discussed improved, but goal less than 7.0%.   Sugars she checks are generally below 138. She thinks she needs to eat more vegetables.  Not clear how many servings she is currently eating.  Walking 25-30 minutes daily. Cannot recall last eye visit.   2.  Hyperlipidemia:  At goal with Simvastatin.   Lipid Panel     Component Value Date/Time   CHOL 123 01/01/2020 0908   TRIG 144 01/01/2020 0908   HDL 48 01/01/2020 0908   CHOLHDL 3.6 07/20/2017 1225   LDLCALC 50 01/01/2020 0908   LABVLDL 25 01/01/2020 0908    3.  Elevated Liver enzymes:  Improving with improved control of cholesterol.    4.  Heart Murmur:  Evaluated in January by Dr. Radford Pax, Cardiology.  Echo showed only concentric hypertrophy, likely from hypertension.    5.  Hypertension:  States taking meds.  Current Meds  Medication Sig  . AgaMatrix Ultra-Thin Lancets MISC Check blood glucose twice weekly before breakfast and twice weekly before dinner.  Marland Kitchen amLODipine (NORVASC) 10 MG tablet Take 1 tablet by mouth once daily  . aspirin 325 MG tablet Take 325 mg by mouth every 6 (six) hours as needed.  . Blood Glucose Monitoring Suppl (AGAMATRIX PRESTO) w/Device KIT Check blood glucose in the morning before breakfast and before dinner each twice weekly  . glucose blood (AGAMATRIX PRESTO TEST) test strip Check blood glucose twice weekly before breakfast and twice weekly before dinner  . hydrochlorothiazide (HYDRODIURIL) 25 MG tablet TAKE 1 TABLET BY MOUTH ONCE DAILY WTIH  LOSARTAN  IN  MORNING.  Marland Kitchen losartan (COZAAR) 100 MG tablet TAKE ONE TABLET BY MOUTH IN MORNING WITH HCTZ.  . metFORMIN (GLUCOPHAGE) 1000 MG tablet TAKE 1 TABLET BY MOUTH TWICE DAILY WITH  A  MEAL.  . simvastatin (ZOCOR) 20 MG tablet 1 tab by mouth daily with dinner   Allergies  Allergen Reactions  . Lisinopril  Cough     Review of Systems    Objective:   BP 138/90 (BP Location: Right Arm, Patient Position: Sitting, Cuff Size: Normal)   Pulse 90   Temp (!) 97.1 F (36.2 C)   Wt 148 lb (67.1 kg)   BMI 32.03 kg/m   Physical Exam NAD HEENT;  PERRl, EOMI Neck:  Supple, No adenopathy Chest;  CTACV:  RRR with SEM.  Carotid, radial and DP pulses normal and equal. Abd:  S, NT, No HSM or mass, + BS LE:  No edema  Assessment & Plan  1.  DM:  Almost at goal.  To continue to work on diet and physical activity. Continue Metformin .  Referral to diabetic eye exam.  2.  Hyperlipidemia:  At goal.  Continue Simvastatin.  3.  Hypertension:  A bit high.  Again, to work on Lifestyle.  Continue Losartan, HCTZ, Amlodipine.  4.  Heart Murmur:  No Valvular or anatomical abnormlities.  5.  Elevated liver enzymes: Likely fatty liver.  Improving with treatment of cholesterol  6.  COVID vaccination:  Has received first Moderna vaccine.

## 2020-04-11 ENCOUNTER — Other Ambulatory Visit: Payer: Self-pay | Admitting: Internal Medicine

## 2020-05-16 ENCOUNTER — Other Ambulatory Visit: Payer: Self-pay | Admitting: Internal Medicine

## 2020-06-30 ENCOUNTER — Other Ambulatory Visit: Payer: Self-pay

## 2020-07-01 ENCOUNTER — Other Ambulatory Visit (INDEPENDENT_AMBULATORY_CARE_PROVIDER_SITE_OTHER): Payer: Self-pay

## 2020-07-01 DIAGNOSIS — E119 Type 2 diabetes mellitus without complications: Secondary | ICD-10-CM

## 2020-07-01 DIAGNOSIS — R748 Abnormal levels of other serum enzymes: Secondary | ICD-10-CM

## 2020-07-02 LAB — HEPATIC FUNCTION PANEL
ALT: 56 IU/L — ABNORMAL HIGH (ref 0–32)
AST: 52 IU/L — ABNORMAL HIGH (ref 0–40)
Albumin: 3.9 g/dL (ref 3.8–4.9)
Alkaline Phosphatase: 149 IU/L — ABNORMAL HIGH (ref 48–121)
Bilirubin Total: 0.2 mg/dL (ref 0.0–1.2)
Bilirubin, Direct: 0.1 mg/dL (ref 0.00–0.40)
Total Protein: 7.6 g/dL (ref 6.0–8.5)

## 2020-07-02 LAB — HGB A1C W/O EAG: Hgb A1c MFr Bld: 7.7 % — ABNORMAL HIGH (ref 4.8–5.6)

## 2020-07-08 ENCOUNTER — Encounter: Payer: Self-pay | Admitting: Internal Medicine

## 2020-07-08 ENCOUNTER — Ambulatory Visit: Payer: Self-pay | Admitting: Internal Medicine

## 2020-07-08 ENCOUNTER — Other Ambulatory Visit: Payer: Self-pay

## 2020-07-08 VITALS — BP 130/86 | HR 76 | Resp 12 | Ht <= 58 in | Wt 148.0 lb

## 2020-07-08 DIAGNOSIS — R748 Abnormal levels of other serum enzymes: Secondary | ICD-10-CM

## 2020-07-08 DIAGNOSIS — E782 Mixed hyperlipidemia: Secondary | ICD-10-CM

## 2020-07-08 DIAGNOSIS — E119 Type 2 diabetes mellitus without complications: Secondary | ICD-10-CM

## 2020-07-08 DIAGNOSIS — I10 Essential (primary) hypertension: Secondary | ICD-10-CM

## 2020-07-08 NOTE — Progress Notes (Signed)
    Subjective:    Patient ID: Tamara Skinner, female   DOB: June 21, 1971, 49 y.o.   MRN: [id number]   HPI  1.  DM:  A1C is now up to 7.7%.  States has been eating more bread.  She states she is walking 20-30 minutes. She feels she has gained weight.  She does not want to add medication to reach goal of under 7.0% with DM.    2.  Elevated liver enzymes:  Felt to be due to fatty liver.  She does feel she has gained weight as above.  Her transaminases were improving with decrease of cholesterol on Simvastatin.  3.  Hypertension: Has not had BP checked elsewhere.  4.  Hyperlipidemia/elevated LDL--was at goal in April.    Current Meds  Medication Sig   AgaMatrix Ultra-Thin Lancets MISC Check blood glucose twice weekly before breakfast and twice weekly before dinner.   amLODipine (NORVASC) 10 MG tablet Take 1 tablet by mouth once daily   aspirin EC 81 MG tablet Take 81 mg by mouth daily. Swallow whole.   Blood Glucose Monitoring Suppl (AGAMATRIX PRESTO) w/Device KIT Check blood glucose in the morning before breakfast and before dinner each twice weekly   glucose blood (AGAMATRIX PRESTO TEST) test strip Check blood glucose twice weekly before breakfast and twice weekly before dinner   hydrochlorothiazide (HYDRODIURIL) 25 MG tablet TAKE 1 TABLET BY MOUTH ONCE DAILY WITH LOSARTAN IN THE MORNING   losartan (COZAAR) 100 MG tablet TAKE 1 TABLET BY MOUTH IN THE MORNING WITH HCTZ.   metFORMIN (GLUCOPHAGE) 1000 MG tablet TAKE 1 TABLET BY MOUTH TWICE DAILY WITH A MEAL   simvastatin (ZOCOR) 20 MG tablet 1 tab by mouth daily with dinner   Allergies  Allergen Reactions   Lisinopril Cough     Review of Systems    Objective:   Blood pressure 130/86, pulse 76, resp. rate 12, height $RemoveBe'4\' 9"'PkoscUyBl$  (1.448 m), weight 148 lb (67.1 kg). BMI 32.03  Physical Exam NAD HEENT:  PERRL, EOMI Neck:   Supple, No adenopathy, no thyromegaly Chest:  CTA CV: RRR with normal S1 and S2, No S3, S4 or murmur.  No carotid  bruits.  Carotid, radial and DP pulses normal and equal. Abd:  S, NT, No HSM or mass.  + BS LE:  No edema.    Assessment & Plan   1.  DM:  worsening control.  She does not want to increase medication, but will work harder with lifestyle changes to improve control.    2.  Hypertension:  control is fair.  As in #1.    3.  Hyperlipidemia and fatty liver:  At goal in the spring.  As in #1 as well.  Continue on Simvastatin  Follow up in 4 months with fasting labs followed by CPE to see if progress.

## 2020-07-08 NOTE — Patient Instructions (Signed)
Tome un vaso de agua antes de cada comida Tome un minimo de 6 a 8 vasos de agua diarios Coma tres veces al dia Coma una proteina y Neomia Dear grasa saludable con comida.  (huevos, pescado, pollo, pavo, y limite carnes rojas Coma 5 porciones diarias de legumbres.  Mezcle los colores Coma 2 porciones diarias de frutas con cascara cuando sea comestible Use platos pequeos Suelte su tenedor o cuchara despues de cada mordida hata que se mastique y se trague Come en la mesa con amigos o familiares por lo menos una vez al dia [place]ague la televisin y aparatos electrnicos durante la comida  Su objetivo debe ser perder una libra por semana  Estudios recientes indican que las personas quienes consumen todos de sus calorias durante 12 horas se bajan de pesocon Mas eficiencia.  Por ejemplo, si Usted come su primera comida a las 7:00 a.m., su comida final del dia se debe completar antes de las 7:00 p.m.  Increase walking to 30-60 minutes cada dia

## 2020-09-20 ENCOUNTER — Other Ambulatory Visit: Payer: Self-pay | Admitting: Internal Medicine

## 2020-09-21 NOTE — Telephone Encounter (Signed)
simvastatin (ZOCOR) 20 MG tablet [[id number]]

## 2020-09-21 NOTE — Telephone Encounter (Signed)
Patient called requesting medication refill for Simvastatin 20 mg tablet- please send to [facility] on [address] Has follow up appointment on 11/26/2019 at 830am for CPE

## 2020-11-16 ENCOUNTER — Telehealth: Payer: Self-pay | Admitting: Internal Medicine

## 2020-11-16 NOTE — Telephone Encounter (Signed)
Patient called requesting anew Rx for losartan (COZAAR) 100 MG tablet with a different dosage since the 100mg  is not available at her pharmacy at .

## 2020-11-17 MED ORDER — LOSARTAN POTASSIUM 50 MG PO TABS
ORAL_TABLET | ORAL | 9 refills | Status: DC
Start: 2020-11-17 — End: 2021-09-12

## 2020-11-17 NOTE — Addendum Note (Signed)
Addended by: Marcene Duos on: 11/17/2020 09:26 AM   Modules accepted: Orders

## 2020-11-19 ENCOUNTER — Other Ambulatory Visit: Payer: Self-pay

## 2020-11-22 ENCOUNTER — Other Ambulatory Visit: Payer: Self-pay

## 2020-11-22 ENCOUNTER — Other Ambulatory Visit: Payer: Self-pay | Admitting: Internal Medicine

## 2020-11-22 DIAGNOSIS — E119 Type 2 diabetes mellitus without complications: Secondary | ICD-10-CM

## 2020-11-22 DIAGNOSIS — E782 Mixed hyperlipidemia: Secondary | ICD-10-CM

## 2020-11-22 DIAGNOSIS — R809 Proteinuria, unspecified: Secondary | ICD-10-CM

## 2020-11-22 DIAGNOSIS — Z79899 Other long term (current) drug therapy: Secondary | ICD-10-CM

## 2020-11-22 NOTE — Progress Notes (Signed)
Here for fasting labs:  FLP, CBC, CMP A1C, urine microalbumin/crea

## 2020-11-23 LAB — CBC WITH DIFFERENTIAL/PLATELET
Basophils Absolute: 0 10*3/uL (ref 0.0–0.2)
Basos: 0 %
EOS (ABSOLUTE): 0.1 10*3/uL (ref 0.0–0.4)
Eos: 1 %
Hematocrit: 35.3 % (ref 34.0–46.6)
Hemoglobin: 10.7 g/dL — ABNORMAL LOW (ref 11.1–15.9)
Immature Grans (Abs): 0 10*3/uL (ref 0.0–0.1)
Immature Granulocytes: 0 %
Lymphocytes Absolute: 1.5 10*3/uL (ref 0.7–3.1)
Lymphs: 22 %
MCH: 21 pg — ABNORMAL LOW (ref 26.6–33.0)
MCHC: 30.3 g/dL — ABNORMAL LOW (ref 31.5–35.7)
MCV: 69 fL — ABNORMAL LOW (ref 79–97)
Monocytes Absolute: 0.6 10*3/uL (ref 0.1–0.9)
Monocytes: 8 %
Neutrophils Absolute: 4.7 10*3/uL (ref 1.4–7.0)
Neutrophils: 69 %
Platelets: 270 10*3/uL (ref 150–450)
RBC: 5.1 x10E6/uL (ref 3.77–5.28)
RDW: 17.8 % — ABNORMAL HIGH (ref 11.7–15.4)
WBC: 6.9 10*3/uL (ref 3.4–10.8)

## 2020-11-23 LAB — COMPREHENSIVE METABOLIC PANEL
ALT: 44 IU/L — ABNORMAL HIGH (ref 0–32)
AST: 35 IU/L (ref 0–40)
Albumin/Globulin Ratio: 1.1 — ABNORMAL LOW (ref 1.2–2.2)
Albumin: 4.1 g/dL (ref 3.8–4.9)
Alkaline Phosphatase: 148 IU/L — ABNORMAL HIGH (ref 44–121)
BUN/Creatinine Ratio: 26 — ABNORMAL HIGH (ref 9–23)
BUN: 18 mg/dL (ref 6–24)
Bilirubin Total: 0.2 mg/dL (ref 0.0–1.2)
CO2: 24 mmol/L (ref 20–29)
Calcium: 9.2 mg/dL (ref 8.7–10.2)
Chloride: 100 mmol/L (ref 96–106)
Creatinine, Ser: 0.69 mg/dL (ref 0.57–1.00)
GFR calc Af Amer: 117 mL/min/{1.73_m2} (ref >59)
GFR calc non Af Amer: 101 mL/min/{1.73_m2} (ref >59)
Globulin, Total: 3.7 g/dL (ref 1.5–4.5)
Glucose: 139 mg/dL — ABNORMAL HIGH (ref 65–99)
Potassium: 3.6 mmol/L (ref 3.5–5.2)
Sodium: 138 mmol/L (ref 134–144)
Total Protein: 7.8 g/dL (ref 6.0–8.5)

## 2020-11-23 LAB — LIPID PANEL W/O CHOL/HDL RATIO
Cholesterol, Total: 166 mg/dL (ref 100–199)
HDL: 51 mg/dL (ref >39)
LDL Chol Calc (NIH): 92 mg/dL (ref 0–99)
Triglycerides: 127 mg/dL (ref 0–149)
VLDL Cholesterol Cal: 23 mg/dL (ref 5–40)

## 2020-11-23 LAB — MICROALBUMIN / CREATININE URINE RATIO
Creatinine, Urine: 41.7 mg/dL
Microalb/Creat Ratio: 53 mg/g creat — ABNORMAL HIGH (ref 0–29)
Microalbumin, Urine: 22.3 ug/mL

## 2020-11-23 LAB — HGB A1C W/O EAG: Hgb A1c MFr Bld: 7.6 % — ABNORMAL HIGH (ref 4.8–5.6)

## 2020-11-25 ENCOUNTER — Ambulatory Visit: Payer: Self-pay | Admitting: Internal Medicine

## 2020-11-25 ENCOUNTER — Encounter: Payer: Self-pay | Admitting: Internal Medicine

## 2020-11-25 ENCOUNTER — Other Ambulatory Visit: Payer: Self-pay

## 2020-11-25 VITALS — BP 142/88 | HR 88 | Resp 12 | Ht <= 58 in | Wt 149.0 lb

## 2020-11-25 DIAGNOSIS — E119 Type 2 diabetes mellitus without complications: Secondary | ICD-10-CM

## 2020-11-25 DIAGNOSIS — E782 Mixed hyperlipidemia: Secondary | ICD-10-CM

## 2020-11-25 DIAGNOSIS — I10 Essential (primary) hypertension: Secondary | ICD-10-CM

## 2020-11-25 DIAGNOSIS — D509 Iron deficiency anemia, unspecified: Secondary | ICD-10-CM

## 2020-11-25 MED ORDER — FERROUS GLUCONATE 324 (38 FE) MG PO TABS: 324.0000 mg | ORAL_TABLET | Freq: Every day | ORAL | 3 refills | Status: AC

## 2020-11-25 NOTE — Progress Notes (Addendum)
Subjective:    Patient ID: Tamara Skinner, female   DOB: [birth year], 50 y.o.   MRN: [id number]   HPI   Interpreted by Rush Barer  Was to have a CPE today, but patient declined--just wants follow up on labs.  1.  Hyperlipidemia:  Has been out of Simvastatin likely since mid December as sounds like she was taking in her old prescription bottle to be filled and pharmacy did not see the new prescription in their system.  Cholesterol up a bit and not at goal with LDL.  Lipid Panel     Component Value Date/Time   CHOL 166 11/22/2020 1023   TRIG 127 11/22/2020 1023   HDL 51 11/22/2020 1023   CHOLHDL 3.6 07/20/2017 1225   LDLCALC 92 11/22/2020 1023   LABVLDL 23 11/22/2020 1023   2.  DM:  A1C down minimally to 7.6 %.  Sugars running in 120-130 range she states both in morning and afternoon before meals.  Not willing to add another medication to get sugars down.  Prefers to make some changes in lifestyle.   Has not had eye check in past year  3.  Elevated transaminases:  Improved.  Will need to compare when back on Simvastatin.  4.  Hypertension:  BP not at goal.    5.  Anemia:  This is new and microcytic.  She has had microcytosis in the past and likely has an abnormal hemoglobin trait with relatively high RBC count and low normal to low Hgb and HCT.  Has not had a period in 2 years.  Stool cards negative for occult blood in March 2021.  No melena or hematochezia.  Current Meds  Medication Sig  . AgaMatrix Ultra-Thin Lancets MISC Check blood glucose twice weekly before breakfast and twice weekly before dinner.  Marland Kitchen amLODipine (NORVASC) 10 MG tablet Take 1 tablet by mouth once daily  . aspirin EC 81 MG tablet Take 81 mg by mouth daily. Swallow whole.  . Blood Glucose Monitoring Suppl (AGAMATRIX PRESTO) w/Device KIT Check blood glucose in the morning before breakfast and before dinner each twice weekly  . glucose blood (AGAMATRIX PRESTO TEST) test strip Check blood glucose  twice weekly before breakfast and twice weekly before dinner  . hydrochlorothiazide (HYDRODIURIL) 25 MG tablet TAKE 1 TABLET BY MOUTH ONCE DAILY WITH LOSARTAN IN THE MORNING  . losartan (COZAAR) 50 MG tablet 2 tabs BY MOUTH IN THE MORNING WITH HCTZ.  . metFORMIN (GLUCOPHAGE) 1000 MG tablet TAKE 1 TABLET BY MOUTH TWICE DAILY WITH A MEAL   Allergies  Allergen Reactions  . Lisinopril Cough     Review of Systems    Objective:   BP (!) 143/99 (BP Location: Left Arm, Patient Position: Sitting, Cuff Size: Normal)   Pulse 88   Resp 12   Ht _0  (1.448 m)   Wt 149 lb (67.6 kg)   LMP 10/30/2017   BMI 32.24 kg/m   Physical Exam  NAD Lungs:  CTA CV:  RRR without murmur or rub.  Radial and DP pulses normal and equal. LE:  No edema   Assessment & Plan   1.  Hyperlipidemia:  Not at goal recently as out of Simvastatin.  She will pick up med and get restarted.  Repeat labs in 6 weeks:  FLP, hepatic profile  2.  DM:  Not at goal, though improved.  She does not wish to add medication, prefers to improve lifestyle with diet and physical activity.  Diabetic eye exam referral.  3.  New anemia with chronic microcytosis:  Cannot rule out GI Blood loss.  Given take home Guaiac cards x 3.  Iron studies added to labs drawn yesterday. Referral to GI for colonoscopy/possibly EGD. Start Ferrous gluconate 324 mg daily.  4.  Hypertension:  Not at goal.  Lifestyle changes as above

## 2020-11-25 NOTE — Patient Instructions (Signed)

## 2020-12-15 ENCOUNTER — Other Ambulatory Visit: Payer: Self-pay | Admitting: Internal Medicine

## 2020-12-17 LAB — IRON AND TIBC
Iron Saturation: 4 % — CL (ref 15–55)
Iron: 15 ug/dL — ABNORMAL LOW (ref 27–159)
Total Iron Binding Capacity: 343 ug/dL (ref 250–450)
UIBC: 328 ug/dL (ref 131–425)

## 2020-12-17 LAB — SPECIMEN STATUS REPORT

## 2021-01-02 DIAGNOSIS — D509 Iron deficiency anemia, unspecified: Secondary | ICD-10-CM | POA: Insufficient documentation

## 2021-01-02 HISTORY — DX: Iron deficiency anemia, unspecified: D50.9

## 2021-01-10 ENCOUNTER — Other Ambulatory Visit: Payer: Self-pay | Admitting: Internal Medicine

## 2021-01-10 ENCOUNTER — Other Ambulatory Visit: Payer: Self-pay

## 2021-01-10 DIAGNOSIS — E119 Type 2 diabetes mellitus without complications: Secondary | ICD-10-CM

## 2021-01-10 DIAGNOSIS — E782 Mixed hyperlipidemia: Secondary | ICD-10-CM

## 2021-01-10 DIAGNOSIS — Z79899 Other long term (current) drug therapy: Secondary | ICD-10-CM

## 2021-01-10 NOTE — Addendum Note (Signed)
Addended by: Marcene Duos on: 01/10/2021 10:25 AM   Modules accepted: Orders

## 2021-01-11 LAB — CBC WITH DIFFERENTIAL/PLATELET
Basophils Absolute: 0 10*3/uL (ref 0.0–0.2)
Basos: 0 %
EOS (ABSOLUTE): 0.1 10*3/uL (ref 0.0–0.4)
Eos: 1 %
Hematocrit: 41.9 % (ref 34.0–46.6)
Hemoglobin: 13.1 g/dL (ref 11.1–15.9)
Immature Grans (Abs): 0 10*3/uL (ref 0.0–0.1)
Immature Granulocytes: 0 %
Lymphocytes Absolute: 1.4 10*3/uL (ref 0.7–3.1)
Lymphs: 20 %
MCH: 24.2 pg — ABNORMAL LOW (ref 26.6–33.0)
MCHC: 31.3 g/dL — ABNORMAL LOW (ref 31.5–35.7)
MCV: 77 fL — ABNORMAL LOW (ref 79–97)
Monocytes Absolute: 0.6 10*3/uL (ref 0.1–0.9)
Monocytes: 8 %
Neutrophils Absolute: 4.9 10*3/uL (ref 1.4–7.0)
Neutrophils: 71 %
Platelets: 201 10*3/uL (ref 150–450)
RBC: 5.42 x10E6/uL — ABNORMAL HIGH (ref 3.77–5.28)
WBC: 6.9 10*3/uL (ref 3.4–10.8)

## 2021-01-11 LAB — HEPATIC FUNCTION PANEL
ALT: 43 IU/L — ABNORMAL HIGH (ref 0–32)
AST: 27 IU/L (ref 0–40)
Albumin: 4.2 g/dL (ref 3.8–4.9)
Alkaline Phosphatase: 143 IU/L — ABNORMAL HIGH (ref 44–121)
Bilirubin Total: 0.2 mg/dL (ref 0.0–1.2)
Bilirubin, Direct: <0.1 mg/dL (ref 0.00–0.40)
Total Protein: 7.6 g/dL (ref 6.0–8.5)

## 2021-01-11 LAB — LIPID PANEL W/O CHOL/HDL RATIO
Cholesterol, Total: 132 mg/dL (ref 100–199)
HDL: 53 mg/dL (ref >39)
LDL Chol Calc (NIH): 58 mg/dL (ref 0–99)
Triglycerides: 120 mg/dL (ref 0–149)
VLDL Cholesterol Cal: 21 mg/dL (ref 5–40)

## 2021-02-10 ENCOUNTER — Telehealth: Payer: Self-pay | Admitting: Internal Medicine

## 2021-02-10 NOTE — Telephone Encounter (Signed)
Patient was notified that her appointment with [facility] gastroenterology was scheduled for May 05, 2021 @ 2:00PM. Patient will need to bring an interpreter to her appointment since the provider does not have interpreters available.   Gastroenterology office requested a copy of the patients Orange Card to be fax to [phone]. Patient agreed to bring new orange card on 02/14/2021.

## 2021-02-12 ENCOUNTER — Other Ambulatory Visit: Payer: Self-pay | Admitting: Internal Medicine

## 2021-02-14 NOTE — Telephone Encounter (Signed)
Patient stopped by the office to provide a copy of her [employer] and fax was sent to [facility].

## 2021-02-15 ENCOUNTER — Telehealth: Payer: Self-pay | Admitting: Internal Medicine

## 2021-02-15 NOTE — Telephone Encounter (Signed)
Patient was made aware of appt on Thursday, 5/19 at 11am with [facility] ([address] [place] I also informed her that she needs an interpreter to go with her to her appt.

## 2021-03-10 IMAGING — MG MM DIGITAL DIAGNOSTIC UNILAT*L* W/ TOMO W/ CAD
4 series · 4 of 12 positions shown · non-contrast
Comparison: 12/04/2019.

CLINICAL DATA: Recall from baseline screening mammography with
tomosynthesis, possible asymmetry involving the UPPER LEFT breast at
MIDDLE depth.

EXAM:
DIGITAL DIAGNOSTIC UNILATERAL LEFT MAMMOGRAM WITH TOMO

[L CC synth-2D]
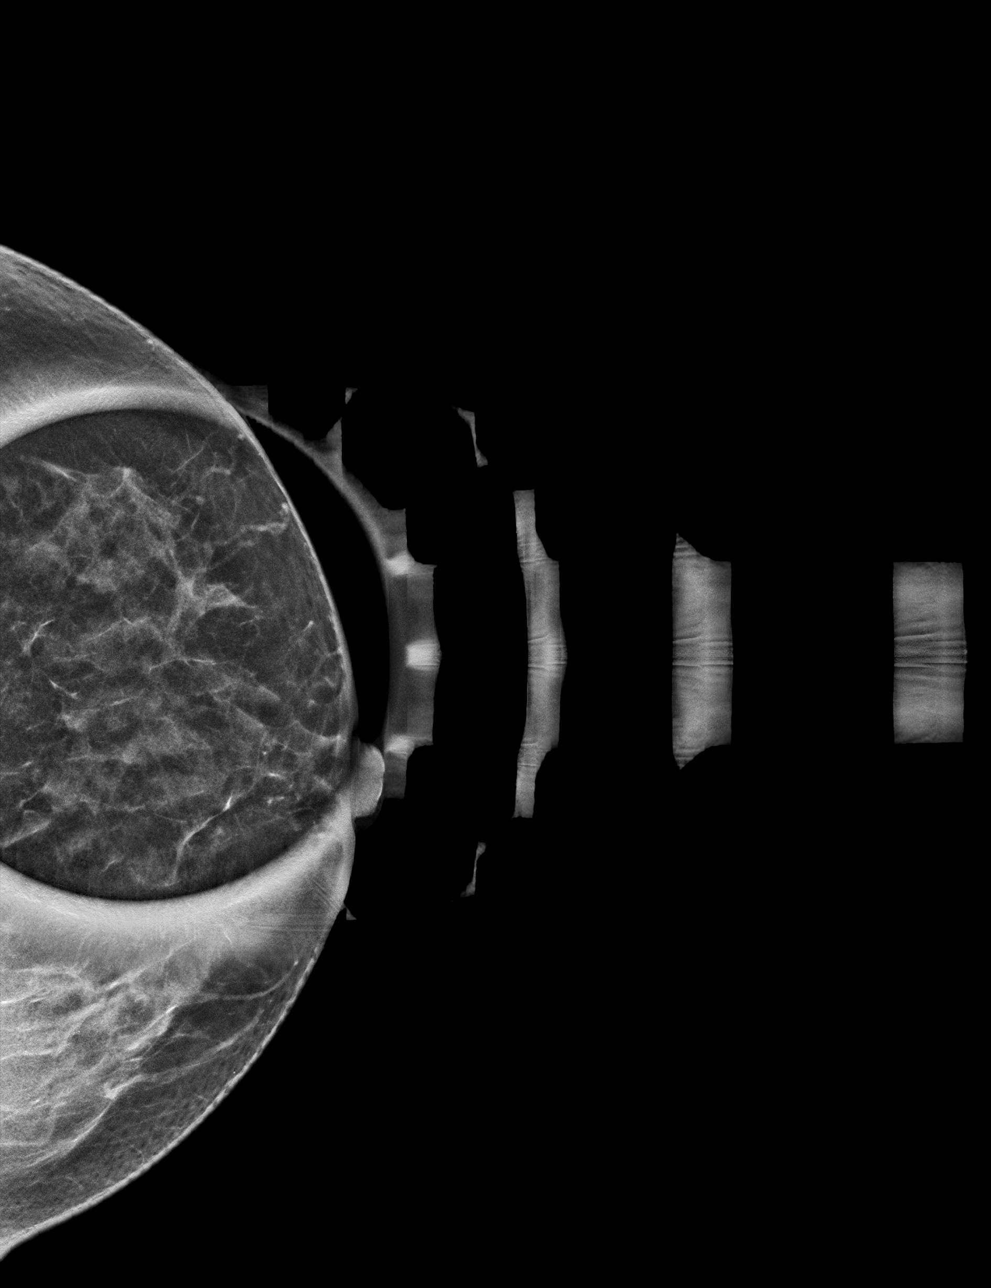

[L MLO synth-2D]
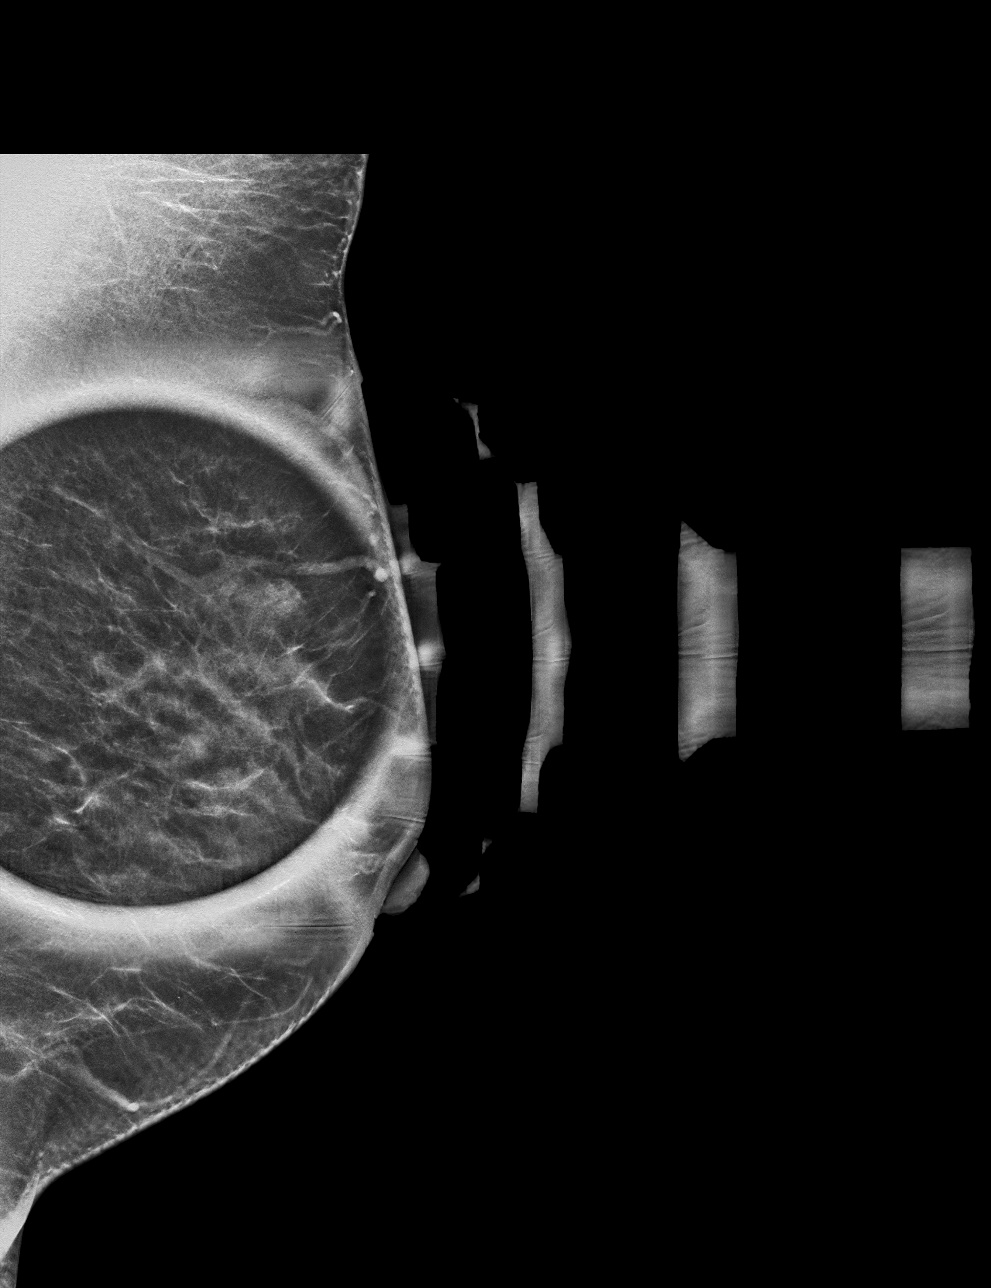

[L MLO tomo · tomo slice 24/47.0]
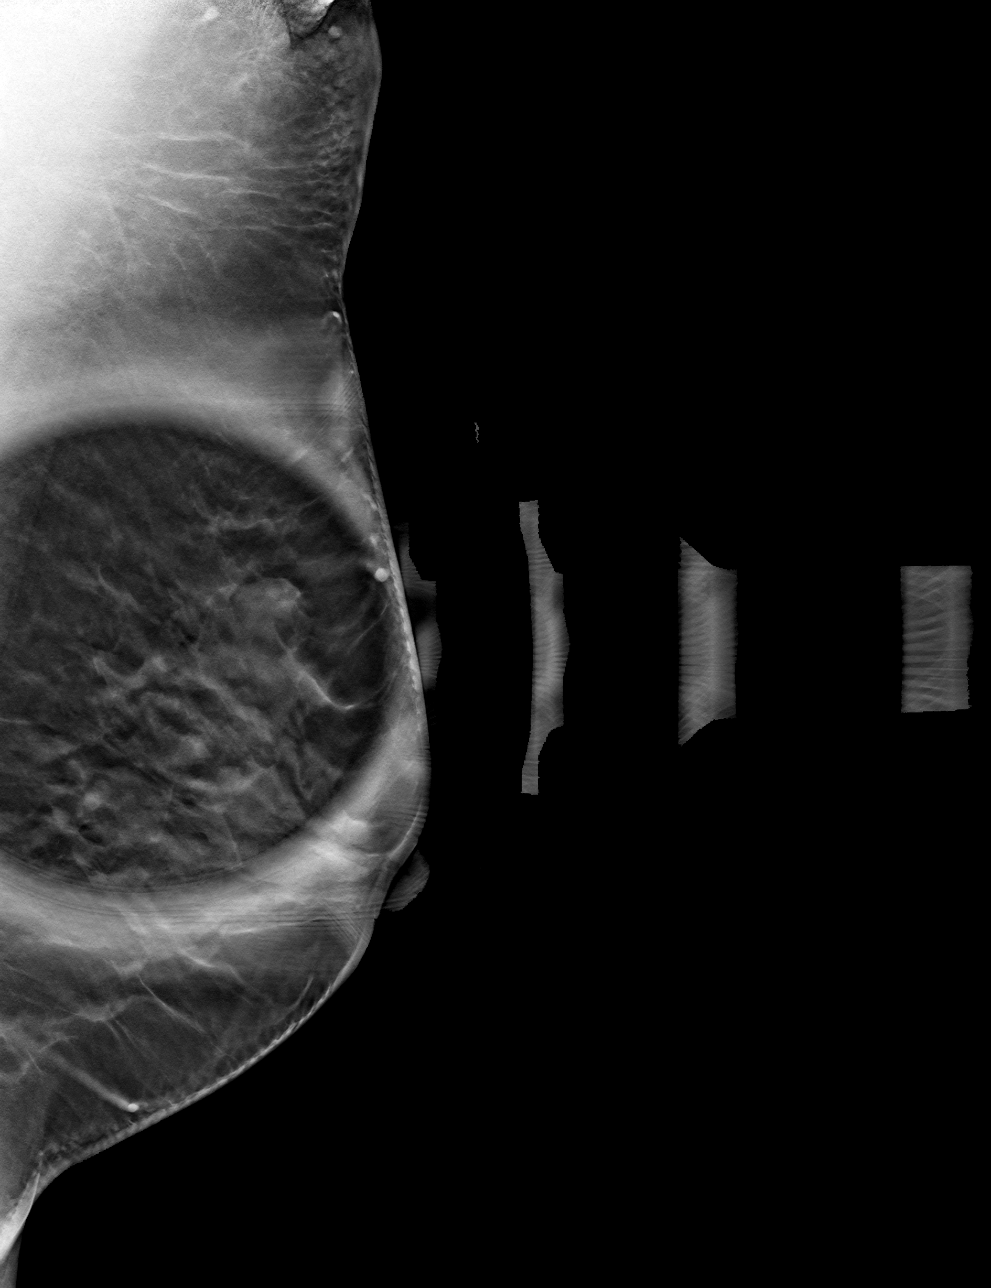

[L CC tomo · tomo slice 21/42.0]
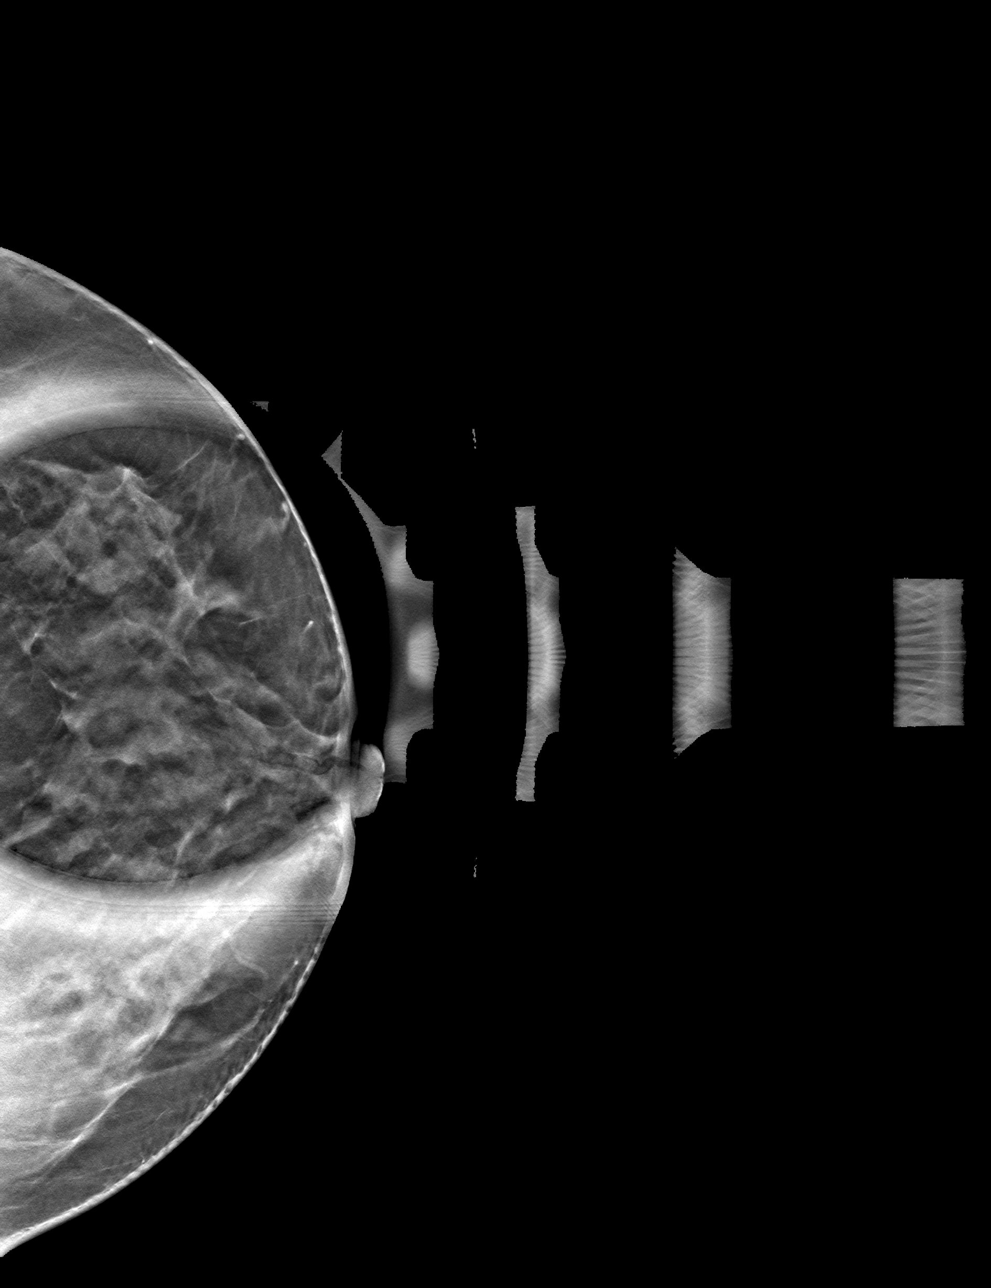

[4 of 12 positions shown; findings below may reference images not displayed]

ACR Breast Density Category c: The breast tissue is heterogeneously
dense, which may obscure small masses.
FINDINGS: Tomosynthesis and synthesized spot-compression CC and MLO views of
the area of concern in the LEFT breast were obtained.

The asymmetry questioned on screening mammography disperses with
compression and there is no underlying mass or architectural
distortion.
IMPRESSION: No mammographic evidence of malignancy involving the LEFT breast.

RECOMMENDATION:
Screening mammogram in one year.(Code:[id number]-H-0EK)

I have discussed the findings and recommendations with the patient.
Communication with the patient was achieved with the assistance of a
certified interpreter. If applicable, a reminder letter will be sent
to the patient regarding the next appointment.

BI-RADS CATEGORY  1: Negative.

## 2021-03-16 ENCOUNTER — Other Ambulatory Visit: Payer: Self-pay | Admitting: Internal Medicine

## 2021-03-21 ENCOUNTER — Telehealth: Payer: Self-pay | Admitting: Internal Medicine

## 2021-03-21 MED ORDER — METFORMIN HCL 1000 MG PO TABS: 1.0000 | ORAL_TABLET | Freq: Two times a day (BID) | ORAL | 8 refills | Status: DC

## 2021-03-21 NOTE — Telephone Encounter (Signed)
Patient requesting a refill of metFORMIN (GLUCOPHAGE) 1000 MG tablet

## 2021-04-04 ENCOUNTER — Other Ambulatory Visit: Payer: Self-pay

## 2021-04-04 ENCOUNTER — Other Ambulatory Visit: Payer: Self-pay | Admitting: Internal Medicine

## 2021-04-04 DIAGNOSIS — E119 Type 2 diabetes mellitus without complications: Secondary | ICD-10-CM

## 2021-04-05 LAB — MICROALBUMIN / CREATININE URINE RATIO
Creatinine, Urine: 148.8 mg/dL
Microalb/Creat Ratio: 15 mg/g creat (ref 0–29)
Microalbumin, Urine: 22 ug/mL

## 2021-04-05 LAB — HGB A1C W/O EAG: Hgb A1c MFr Bld: 7.3 % — ABNORMAL HIGH (ref 4.8–5.6)

## 2021-04-12 ENCOUNTER — Ambulatory Visit: Payer: Self-pay | Admitting: Internal Medicine

## 2021-04-12 ENCOUNTER — Other Ambulatory Visit: Payer: Self-pay

## 2021-04-12 ENCOUNTER — Encounter: Payer: Self-pay | Admitting: Internal Medicine

## 2021-04-12 VITALS — BP 134/78 | HR 72 | Resp 12 | Ht <= 58 in | Wt 140.0 lb

## 2021-04-12 DIAGNOSIS — I10 Essential (primary) hypertension: Secondary | ICD-10-CM

## 2021-04-12 DIAGNOSIS — R011 Cardiac murmur, unspecified: Secondary | ICD-10-CM | POA: Insufficient documentation

## 2021-04-12 DIAGNOSIS — E119 Type 2 diabetes mellitus without complications: Secondary | ICD-10-CM

## 2021-04-12 DIAGNOSIS — R809 Proteinuria, unspecified: Secondary | ICD-10-CM

## 2021-04-12 LAB — GLUCOSE, POCT (MANUAL RESULT ENTRY): POC Glucose: 233 mg/dl — AB (ref 70–99)

## 2021-04-12 NOTE — Progress Notes (Signed)
    Subjective:    Patient ID: Tamara Skinner, female   DOB: 10/01/1971, 50 y.o.   MRN: [id number]   HPI  Estefania Carmelia Bake interprets   DM:  A1C now down to 7.3%.  Discussed improved, but not at goal yet.  Walking more and drinking juices.  Describes blending fruits, green leafy veggies and other veggies and drinking regularly.  Does not filter after blending.  Does not remove edible skin of fruits and veggies prior to blending.  Has lost 9 lbs as well  2.  Microalbuminuria:  noted in January.  Now normal.    3.  Hypertension:  controlled.  Current Meds  Medication Sig   AgaMatrix Ultra-Thin Lancets MISC Check blood glucose twice weekly before breakfast and twice weekly before dinner.   amLODipine (NORVASC) 10 MG tablet Take 1 tablet by mouth once daily   aspirin EC 81 MG tablet Take 81 mg by mouth daily. Swallow whole.   Blood Glucose Monitoring Suppl (AGAMATRIX PRESTO) w/Device KIT Check blood glucose in the morning before breakfast and before dinner each twice weekly   ferrous gluconate (FERGON) 324 MG tablet Take 1 tablet (324 mg total) by mouth daily with breakfast.   glucose blood (AGAMATRIX PRESTO TEST) test strip Check blood glucose twice weekly before breakfast and twice weekly before dinner   hydrochlorothiazide (HYDRODIURIL) 25 MG tablet TAKE 1 TABLET BY MOUTH ONCE DAILY WITH LOSARTAN IN THE MORNING   losartan (COZAAR) 50 MG tablet 2 tabs BY MOUTH IN THE MORNING WITH HCTZ.   metFORMIN (GLUCOPHAGE) 1000 MG tablet Take 1 tablet (1,000 mg total) by mouth 2 (two) times daily with a meal.   simvastatin (ZOCOR) 20 MG tablet TAKE 1 TABLET BY MOUTH DAILY WITH DINNER   Allergies  Allergen Reactions   Lisinopril Cough     Review of Systems    Objective:   BP 134/78 (BP Location: Left Arm, Patient Position: Sitting, Cuff Size: Normal)   Pulse 72   Resp 12   Ht _0  (1.448 m)   Wt 140 lb (63.5 kg)   LMP  (LMP Unknown)   BMI 30.30 kg/m   Physical Exam NAD HEENT:   PERRL, EOMI,  Neck:  Supple, No adenopathy Chest:  CTA CV: RRR with grade I-II/VI SEM at LUSB and louder and right 2nd interspace, but does not radiate to carotids.  Carotid, radial and DP pulses normal and equal Abd:  S, NT, No HSM or mass, + BS LE:  No edema.  Diabetic foot exam was performed with the following findings:   No deformities, ulcerations, or other skin breakdown Normal sensation of 10g monofilament Intact posterior tibialis and dorsalis pedis pulses       Assessment & Plan   DM:  improved control with changes in lifestyle.  Encouraged her not to miss her breakfast and to take most of her meds with breakfast in morning.    2.  Microalbuminuria:  resolved.  3.  Hypertension:  Controlled.  4.  Heart murmur:  echo in 2021 normal.    Return for A1C in 4 months and with me end of January for CPE without pap

## 2021-08-11 ENCOUNTER — Other Ambulatory Visit: Payer: Self-pay

## 2021-08-11 DIAGNOSIS — E119 Type 2 diabetes mellitus without complications: Secondary | ICD-10-CM

## 2021-08-12 ENCOUNTER — Other Ambulatory Visit: Payer: Self-pay

## 2021-08-12 LAB — HEMOGLOBIN A1C
Est. average glucose Bld gHb Est-mCnc: 151 mg/dL
Hgb A1c MFr Bld: 6.9 % — ABNORMAL HIGH (ref 4.8–5.6)

## 2021-09-10 ENCOUNTER — Other Ambulatory Visit: Payer: Self-pay | Admitting: Internal Medicine

## 2021-10-09 ENCOUNTER — Other Ambulatory Visit: Payer: Self-pay | Admitting: Internal Medicine

## 2021-11-22 ENCOUNTER — Encounter: Payer: Self-pay | Admitting: Internal Medicine

## 2021-12-08 ENCOUNTER — Other Ambulatory Visit: Payer: Self-pay | Admitting: Internal Medicine

## 2021-12-15 ENCOUNTER — Other Ambulatory Visit: Payer: Self-pay | Admitting: Internal Medicine

## 2021-12-17 ENCOUNTER — Other Ambulatory Visit: Payer: Self-pay | Admitting: Internal Medicine

## 2022-01-20 ENCOUNTER — Ambulatory Visit: Payer: Self-pay | Admitting: Internal Medicine

## 2022-01-20 ENCOUNTER — Encounter: Payer: Self-pay | Admitting: Internal Medicine

## 2022-01-20 ENCOUNTER — Other Ambulatory Visit: Payer: Self-pay

## 2022-01-20 VITALS — BP 144/86 | HR 88 | Resp 12 | Ht <= 58 in | Wt 147.0 lb

## 2022-01-20 DIAGNOSIS — R011 Cardiac murmur, unspecified: Secondary | ICD-10-CM

## 2022-01-20 DIAGNOSIS — Z23 Encounter for immunization: Secondary | ICD-10-CM

## 2022-01-20 DIAGNOSIS — E782 Mixed hyperlipidemia: Secondary | ICD-10-CM

## 2022-01-20 DIAGNOSIS — Z1231 Encounter for screening mammogram for malignant neoplasm of breast: Secondary | ICD-10-CM

## 2022-01-20 DIAGNOSIS — R809 Proteinuria, unspecified: Secondary | ICD-10-CM

## 2022-01-20 DIAGNOSIS — E119 Type 2 diabetes mellitus without complications: Secondary | ICD-10-CM

## 2022-01-20 DIAGNOSIS — E66811 Obesity, class 1: Secondary | ICD-10-CM

## 2022-01-20 DIAGNOSIS — E669 Obesity, unspecified: Secondary | ICD-10-CM

## 2022-01-20 DIAGNOSIS — Z0000 Encounter for general adult medical examination without abnormal findings: Secondary | ICD-10-CM

## 2022-01-20 DIAGNOSIS — D509 Iron deficiency anemia, unspecified: Secondary | ICD-10-CM

## 2022-01-20 DIAGNOSIS — Z9189 Other specified personal risk factors, not elsewhere classified: Secondary | ICD-10-CM

## 2022-01-20 DIAGNOSIS — I10 Essential (primary) hypertension: Secondary | ICD-10-CM

## 2022-01-20 MED ORDER — AGAMATRIX ULTRA-THIN LANCETS MISC: 11 refills | Status: DC

## 2022-01-20 MED ORDER — AGAMATRIX PRESTO TEST VI STRP
ORAL_STRIP | 11 refills | Status: DC
Start: 2022-01-20 — End: 2024-08-20

## 2022-01-20 NOTE — Progress Notes (Signed)
Subjective:    Patient ID: Tamara Skinner, female   DOB: 12-12-1970, 51 y.o.   MRN: [id number]   HPI  CPE without pap  1.  Pap:  Last 11/20/2019 and normal.  2.  Mammogram:  Last 12/04/2019 with extra views.  Did not obtain last last year.  No family history of breast cancer.  3.  Osteoprevention:  Drinks 2 cups milk daily.  Almond and 2% milk.  She is not being physically active outside of work.  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    4.  Guaiac Cards/FIT:  Last checked 01/01/20 and negative for blood.    5.  Colonoscopy:  Never.  No family history of colon cancer.    6.  Immunizations:  Has not had Shingrix or Bivalent COVID booster.   Immunization History  Administered Date(s) Administered   Influenza Inj Mdck Quad Pf 08/28/2019   Influenza,inj,Quad PF,6+ Mos 08/28/2017   Influenza-Unspecified 08/07/2018, 07/19/2020, 08/01/2021   Moderna Sars-Covid-2 Vaccination 01/19/2020, 02/16/2020, 08/23/2020, 02/23/2021   Pneumococcal Conjugate-13 08/28/2017   Pneumococcal Polysaccharide-23 05/22/2019   Tdap 08/28/2017     7.  Glucose/Cholesterol:  Diabetes with hyperlipidemia:  Last A1C 6.9% 07/2021.  Cholesterol at goal 1 year ago.   Lipid Panel     Component Value Date/Time   CHOL 132 01/10/2021 1027   TRIG 120 01/10/2021 1027   HDL 53 01/10/2021 1027   CHOLHDL 3.6 07/20/2017 1225   LDLCALC 58 01/10/2021 1027   LABVLDL 21 01/10/2021 1027     Current Meds  Medication Sig   amLODipine (NORVASC) 10 MG tablet Take 1 tablet by mouth once daily   aspirin EC 81 MG tablet Take 81 mg by mouth daily. Swallow whole.   ferrous gluconate (FERGON) 324 MG tablet Take 1 tablet (324 mg total) by mouth daily with breakfast.   hydrochlorothiazide (HYDRODIURIL) 25 MG tablet TAKE 1 TABLET BY MOUTH ONCE DAILY WITH  LOSARTAN  IN  THE  MORNING   ibuprofen (ADVIL) 200 MG tablet Take 200 mg by mouth every 6 (six) hours as needed.   losartan (COZAAR) 50 MG tablet TAKE 2  TABLETS BY MOUTH IN THE MORNING WITH  HCTZ   metFORMIN (GLUCOPHAGE) 1000 MG tablet TAKE 1 TABLET BY MOUTH TWICE DAILY WITH MEALS   simvastatin (ZOCOR) 20 MG tablet TAKE 1 TABLET BY MOUTH ONCE DAILY WITH  DINNER   Allergies  Allergen Reactions   Lisinopril Cough   Past Medical History:  Diagnosis Date   Diabetes mellitus without complication (HCC) 2016   Hyperlipidemia 2020   Hypertension 2016   Obesity (BMI 30.0-34.9)    Past Surgical History:  Procedure Laterality Date   TUBAL LIGATION     Family History  Problem Relation Age of Onset   Hypertension Mother    Diabetes Mother    Hypertension Father    Stroke Father        Three strokes   Diabetes Sister    Hypertension Sister    Diabetes Brother    Hypertension Brother    Diabetes Brother    Hypertension Brother    Breast cancer Neg Hx    Social History   Socioeconomic History   Marital status: Single    Spouse name: Not on file   Number of children: 4   Years of education: 12   Highest education level: High school graduate  Occupational History   Occupation: Roofing clean up  Tobacco Use  Smoking status: Never   Smokeless tobacco: Never  Vaping Use   Vaping Use: Never used  Substance and Sexual Activity   Alcohol use: No   Drug use: No   Sexual activity: Not Currently    Birth control/protection: Surgical  Other Topics Concern   Not on file  Social History Narrative   Lives with her sister and 2 nieces.     Social Determinants of Health   Financial Resource Strain: Low Risk  (01/20/2022)   Overall Financial Resource Strain (CARDIA)    Difficulty of Paying Living Expenses: Not hard at all  Food Insecurity: Food Insecurity Present (06/01/2022)   Hunger Vital Sign    Worried About Running Out of Food in the Last Year: Sometimes true    Ran Out of Food in the Last Year: Sometimes true  Transportation Needs: No Transportation Needs (06/01/2022)   PRAPARE - Administrator, Civil Service  (Medical): No    Lack of Transportation (Non-Medical): No  Physical Activity: Insufficiently Active (11/20/2019)   Exercise Vital Sign    Days of Exercise per Week: 7 days    Minutes of Exercise per Session: 20 min  Stress: Stress Concern Present (11/21/2018)   [employer] of Occupational Health - Occupational Stress Questionnaire    Feeling of Stress : Very much  Social Connections: Somewhat Isolated (11/21/2018)   Social Connection and Isolation Panel [NHANES]    Frequency of Communication with Friends and Family: More than three times a week    Frequency of Social Gatherings with Friends and Family: Twice a week    Attends Religious Services: More than 4 times per year    Active Member of [employer] or Organizations: No    Attends Banker Meetings: Never    Marital Status: Never married  Intimate Partner Violence: Not At Risk (11/20/2019)   Humiliation, Afraid, Rape, and Kick questionnaire    Fear of Current or Ex-Partner: No    Emotionally Abused: No    Physically Abused: No    Sexually Abused: No       Review of Systems  HENT:  Negative for dental problem.   Eyes:  Negative for visual disturbance (Thinks she had her diabetic eye exam in September 2022.).  Respiratory:  Negative for shortness of breath.   Cardiovascular:  Negative for chest pain, palpitations and leg swelling.  Gastrointestinal:  Negative for abdominal pain, blood in stool (No melena), constipation and diarrhea.  Genitourinary:  Negative for vaginal bleeding and vaginal discharge.  Musculoskeletal:  Negative for arthralgias.  Neurological:  Negative for weakness and numbness.  Psychiatric/Behavioral:  Negative for dysphoric mood. The patient is not nervous/anxious.      Objective:   BP (!) 154/98 (BP Location: Left Arm, Patient Position: Sitting, Cuff Size: Normal)   Pulse 88   Resp 12   Ht 4' 9.5" (1.461 m)   Wt 147 lb (66.7 kg)   LMP  (LMP Unknown)   BMI 31.26 kg/m   Physical  Exam Constitutional:      Appearance: Normal appearance.  HENT:     Head: Normocephalic and atraumatic.     Right Ear: Tympanic membrane, ear canal and external ear normal.     Left Ear: Tympanic membrane, ear canal and external ear normal.     Nose: Nose normal.     Mouth/Throat:     Mouth: Mucous membranes are moist.     Pharynx: Oropharynx is clear.     Comments: Dental decay  Eyes:     Extraocular Movements: Extraocular movements intact.     Conjunctiva/sclera: Conjunctivae normal.     Pupils: Pupils are equal, round, and reactive to light.     Comments: Discs sharp bilaterally  Neck:     Thyroid: No thyroid mass or thyromegaly.  Cardiovascular:     Pulses:          Dorsalis pedis pulses are 2+ on the right side and 2+ on the left side.       Posterior tibial pulses are 2+ on the right side and 2+ on the left side.     Heart sounds: S1 normal and S2 normal. Murmur heard.     Systolic (Radiates faintly to carotids bilaterally) murmur is present with a grade of 2/6.     No S3 or S4 sounds.     Comments: No carotid bruits.  Carotid, radial, femoral, DP and PT pulses normal and equal.    Pulmonary:     Effort: Pulmonary effort is normal.     Breath sounds: Normal breath sounds.  Chest:  Breasts:    Right: No inverted nipple, mass or nipple discharge.     Left: No inverted nipple, mass or nipple discharge.  Abdominal:     General: Bowel sounds are normal.     Palpations: Abdomen is soft. There is no hepatomegaly, splenomegaly or mass.     Tenderness: There is no abdominal tenderness.     Hernia: No hernia is present.  Genitourinary:    Comments: Normal external female genitalia No uterine or adnexal mass or tenderness. Musculoskeletal:        General: Normal range of motion.     Cervical back: Normal range of motion and neck supple.     Right lower leg: No edema.     Left lower leg: No edema.  Feet:     Right foot:     Protective Sensation: 10 sites tested.  10 sites  sensed.     Skin integrity: Skin integrity normal.     Left foot:     Protective Sensation: 10 sites tested.  10 sites sensed.     Skin integrity: Skin integrity normal.  Lymphadenopathy:     Head:     Right side of head: No submental or submandibular adenopathy.     Left side of head: No submental or submandibular adenopathy.     Cervical: No cervical adenopathy.     Upper Body:     Right upper body: No supraclavicular or axillary adenopathy.     Left upper body: No supraclavicular or axillary adenopathy.     Lower Body: No right inguinal adenopathy. No left inguinal adenopathy.  Skin:    General: Skin is warm.     Capillary Refill: Capillary refill takes less than 2 seconds.     Findings: No rash.  Neurological:     General: No focal deficit present.     Mental Status: She is alert and oriented to person, place, and time.     Cranial Nerves: Cranial nerves 2-12 are intact.     Sensory: Sensation is intact.     Motor: Motor function is intact.     Coordination: Coordination is intact.     Gait: Gait is intact.     Deep Tendon Reflexes: Reflexes are normal and symmetric.  Psychiatric:        Speech: Speech normal.        Behavior: Behavior normal. Behavior is cooperative.   orm  SEM to carotids lightly only with sitting  Assessment & Plan   CPE without pap Mammogram ordered Shingrix and Moderna bivalent vaccines. FIT to return in 2 weeks. Fasting labs.   Eye exam in September  2.  Dental decay:  dental referral  3.  DM:  A1C, urine microalbumin/crea.  Eye exam in fall.  Encourage influenza vaccine in fall  4.  Heart murmur:  Echo in 2021 with some diastolic dysfunction due to hypertension, but otherwise normal structurally.  No findings for cause of murmur.    5.  Hyperlipidemia:  FLP, CMP.  6.  Hx of anemia:  CBC  7.  Hypertension:  recheck BP after labs drawn

## 2022-01-22 LAB — COMPREHENSIVE METABOLIC PANEL
ALT: 51 IU/L — ABNORMAL HIGH (ref 0–32)
AST: 32 IU/L (ref 0–40)
Albumin/Globulin Ratio: 1.5 (ref 1.2–2.2)
Albumin: 4.5 g/dL (ref 3.8–4.9)
Alkaline Phosphatase: 128 IU/L — ABNORMAL HIGH (ref 44–121)
BUN/Creatinine Ratio: 30 — ABNORMAL HIGH (ref 9–23)
BUN: 20 mg/dL (ref 6–24)
Bilirubin Total: 0.3 mg/dL (ref 0.0–1.2)
CO2: 23 mmol/L (ref 20–29)
Calcium: 9.4 mg/dL (ref 8.7–10.2)
Chloride: 99 mmol/L (ref 96–106)
Creatinine, Ser: 0.67 mg/dL (ref 0.57–1.00)
Globulin, Total: 3 g/dL (ref 1.5–4.5)
Glucose: 158 mg/dL — ABNORMAL HIGH (ref 70–99)
Potassium: 3.4 mmol/L — ABNORMAL LOW (ref 3.5–5.2)
Sodium: 138 mmol/L (ref 134–144)
Total Protein: 7.5 g/dL (ref 6.0–8.5)
eGFR: 105 mL/min/{1.73_m2} (ref >59)

## 2022-01-22 LAB — LIPID PANEL W/O CHOL/HDL RATIO
Cholesterol, Total: 138 mg/dL (ref 100–199)
HDL: 52 mg/dL (ref >39)
LDL Chol Calc (NIH): 65 mg/dL (ref 0–99)
Triglycerides: 118 mg/dL (ref 0–149)
VLDL Cholesterol Cal: 21 mg/dL (ref 5–40)

## 2022-01-22 LAB — CBC WITH DIFFERENTIAL/PLATELET
Basophils Absolute: 0.1 10*3/uL (ref 0.0–0.2)
Basos: 1 %
EOS (ABSOLUTE): 0.1 10*3/uL (ref 0.0–0.4)
Eos: 2 %
Hematocrit: 44 % (ref 34.0–46.6)
Hemoglobin: 14.9 g/dL (ref 11.1–15.9)
Immature Grans (Abs): 0 10*3/uL (ref 0.0–0.1)
Immature Granulocytes: 0 %
Lymphocytes Absolute: 1.5 10*3/uL (ref 0.7–3.1)
Lymphs: 21 %
MCH: 29.2 pg (ref 26.6–33.0)
MCHC: 33.9 g/dL (ref 31.5–35.7)
MCV: 86 fL (ref 79–97)
Monocytes Absolute: 0.5 10*3/uL (ref 0.1–0.9)
Monocytes: 7 %
Neutrophils Absolute: 5.2 10*3/uL (ref 1.4–7.0)
Neutrophils: 69 %
Platelets: 194 10*3/uL (ref 150–450)
RBC: 5.1 x10E6/uL (ref 3.77–5.28)
RDW: 13.1 % (ref 11.7–15.4)
WBC: 7.4 10*3/uL (ref 3.4–10.8)

## 2022-01-22 LAB — MICROALBUMIN / CREATININE URINE RATIO
Creatinine, Urine: 48.1 mg/dL
Microalb/Creat Ratio: 59 mg/g creat — ABNORMAL HIGH (ref 0–29)
Microalbumin, Urine: 28.5 ug/mL

## 2022-01-22 LAB — HGB A1C W/O EAG: Hgb A1c MFr Bld: 7.4 % — ABNORMAL HIGH (ref 4.8–5.6)

## 2022-01-25 ENCOUNTER — Other Ambulatory Visit: Payer: Self-pay | Admitting: Obstetrics and Gynecology

## 2022-01-25 DIAGNOSIS — Z1231 Encounter for screening mammogram for malignant neoplasm of breast: Secondary | ICD-10-CM

## 2022-02-20 ENCOUNTER — Ambulatory Visit: Payer: Self-pay | Admitting: Internal Medicine

## 2022-02-20 VITALS — BP 138/92 | HR 76

## 2022-02-20 DIAGNOSIS — Z0130 Encounter for examination of blood pressure without abnormal findings: Secondary | ICD-10-CM

## 2022-02-20 MED ORDER — LOSARTAN POTASSIUM 100 MG PO TABS: ORAL_TABLET | ORAL | 11 refills | Status: DC

## 2022-02-20 NOTE — Progress Notes (Signed)
Patient reported that she is taking all bp medication consistently. Patient took bp medication this morning. ? ?After reporting bp to Dr Delrae Alfred, Losartan will be increased to 100 mg 2 tabs by mouth daily. ?

## 2022-03-09 ENCOUNTER — Other Ambulatory Visit: Payer: Self-pay | Admitting: Internal Medicine

## 2022-03-09 ENCOUNTER — Ambulatory Visit: Payer: Self-pay

## 2022-03-09 VITALS — BP 130/88 | HR 80

## 2022-03-09 DIAGNOSIS — Z1231 Encounter for screening mammogram for malignant neoplasm of breast: Secondary | ICD-10-CM

## 2022-03-09 DIAGNOSIS — Z0130 Encounter for examination of blood pressure without abnormal findings: Secondary | ICD-10-CM

## 2022-03-09 NOTE — Progress Notes (Signed)
Patient reported that she has been taking bp medication consistently. ? ?After reporting bp to Dr Delrae Alfred, no changes will be made to medication. ?

## 2022-03-15 ENCOUNTER — Other Ambulatory Visit: Payer: Self-pay

## 2022-03-15 DIAGNOSIS — Z79899 Other long term (current) drug therapy: Secondary | ICD-10-CM

## 2022-03-16 ENCOUNTER — Other Ambulatory Visit: Payer: Self-pay

## 2022-03-16 DIAGNOSIS — Z1231 Encounter for screening mammogram for malignant neoplasm of breast: Secondary | ICD-10-CM

## 2022-03-16 LAB — COMPREHENSIVE METABOLIC PANEL
ALT: 52 IU/L — ABNORMAL HIGH (ref 0–32)
AST: 36 IU/L (ref 0–40)
Albumin/Globulin Ratio: 1.3 (ref 1.2–2.2)
Albumin: 4.1 g/dL (ref 3.8–4.9)
Alkaline Phosphatase: 115 IU/L (ref 44–121)
BUN/Creatinine Ratio: 30 — ABNORMAL HIGH (ref 9–23)
BUN: 17 mg/dL (ref 6–24)
Bilirubin Total: 0.4 mg/dL (ref 0.0–1.2)
CO2: 21 mmol/L (ref 20–29)
Calcium: 8.8 mg/dL (ref 8.7–10.2)
Chloride: 102 mmol/L (ref 96–106)
Creatinine, Ser: 0.57 mg/dL (ref 0.57–1.00)
Globulin, Total: 3.1 g/dL (ref 1.5–4.5)
Glucose: 151 mg/dL — ABNORMAL HIGH (ref 70–99)
Potassium: 3.4 mmol/L — ABNORMAL LOW (ref 3.5–5.2)
Sodium: 138 mmol/L (ref 134–144)
Total Protein: 7.2 g/dL (ref 6.0–8.5)
eGFR: 109 mL/min/{1.73_m2} (ref >59)

## 2022-03-28 ENCOUNTER — Telehealth: Payer: Self-pay

## 2022-03-28 ENCOUNTER — Ambulatory Visit: Payer: No Typology Code available for payment source

## 2022-03-28 NOTE — Telephone Encounter (Signed)
Telephoned patient, using interpreter Rudene Anda. Left voice message confirming patient appointment.

## 2022-04-18 ENCOUNTER — Other Ambulatory Visit: Payer: Self-pay | Admitting: Internal Medicine

## 2022-05-14 ENCOUNTER — Other Ambulatory Visit: Payer: Self-pay | Admitting: Internal Medicine

## 2022-05-31 ENCOUNTER — Other Ambulatory Visit: Payer: Self-pay | Admitting: Internal Medicine

## 2022-05-31 DIAGNOSIS — Z1231 Encounter for screening mammogram for malignant neoplasm of breast: Secondary | ICD-10-CM

## 2022-06-01 ENCOUNTER — Ambulatory Visit
Admission: RE | Admit: 2022-06-01 | Discharge: 2022-06-01 | Disposition: A | Discharge status: Home / Self Care | Payer: No Typology Code available for payment source | Source: Ambulatory Visit | Attending: Internal Medicine | Admitting: Internal Medicine

## 2022-06-01 ENCOUNTER — Ambulatory Visit: Payer: Self-pay | Admitting: *Deleted

## 2022-06-01 ENCOUNTER — Ambulatory Visit: Payer: No Typology Code available for payment source

## 2022-06-01 VITALS — BP 140/82 | Wt 148.3 lb

## 2022-06-01 DIAGNOSIS — Z1239 Encounter for other screening for malignant neoplasm of breast: Secondary | ICD-10-CM

## 2022-06-01 DIAGNOSIS — Z1231 Encounter for screening mammogram for malignant neoplasm of breast: Secondary | ICD-10-CM

## 2022-06-01 NOTE — Patient Instructions (Signed)
Explained breast self awareness with Tamara Skinner. Patient did not need a Pap smear today due to last Pap smear was 11/20/2019. Let her know BCCCP will cover Pap smears every 3 years unless has a history of abnormal Pap smears. Referred patient to the [facility] for a screening mammogram on mobile unit. Appointment scheduled Thursday, June 01, 2022 at 1040. Patient aware of appointment and will be there. Let patient know the Breast Center will follow up with her within the next couple weeks with results of her mammogram by letter or phone. Tamara Skinner verbalized understanding.  Claudett Bayly, Kathaleen Maser, RN 9:07 AM

## 2022-06-01 NOTE — Progress Notes (Addendum)
Tamara Skinner is a 51 y.o. female who presents to [facility] clinic today with no complaints.    Pap Smear: Pap smear not completed today. Last Pap smear was 11/20/2019 at [facility] and was normal. Per patient has no history of an abnormal Pap smear. Last Pap smear result is available in Epic.   Physical exam: Breasts Breasts symmetrical. No skin abnormalities bilateral breasts. No nipple retraction bilateral breasts. No nipple discharge bilateral breasts. No lymphadenopathy. No lumps palpated bilateral breasts. No complaints of pain or tenderness.      MM DIAG BREAST TOMO UNI LEFT  Result Date: 12/16/2019 CLINICAL DATA:  Recall from baseline screening mammography with tomosynthesis, possible asymmetry involving the UPPER LEFT breast at MIDDLE depth. EXAM: DIGITAL DIAGNOSTIC UNILATERAL LEFT MAMMOGRAM WITH TOMO COMPARISON:  12/04/2019. ACR Breast Density Category c: The breast tissue is heterogeneously dense, which may obscure small masses. FINDINGS: Tomosynthesis and synthesized spot-compression CC and MLO views of the area of concern in the LEFT breast were obtained. The asymmetry questioned on screening mammography disperses with compression and there is no underlying mass or architectural distortion. IMPRESSION: No mammographic evidence of malignancy involving the LEFT breast. RECOMMENDATION: Screening mammogram in one year.(Code:SM-B-01Y) I have discussed the findings and recommendations with the patient. Communication with the patient was achieved with the assistance of a certified interpreter. If applicable, a reminder letter will be sent to the patient regarding the next appointment. BI-RADS CATEGORY  1: Negative. Electronically Signed   By: Hulan Saas M.D.   On: 12/16/2019 15:17   MS DIGITAL SCREENING TOMO BILATERAL  Result Date: 12/05/2019 CLINICAL DATA:  Screening. EXAM: DIGITAL SCREENING BILATERAL MAMMOGRAM WITH TOMO AND CAD COMPARISON:  None. ACR Breast Density Category c:  The breast tissue is heterogeneously dense, which may obscure small masses. FINDINGS: In the left breast, a possible asymmetry warrants further evaluation. This possible asymmetry is seen within the upper LEFT breast. In the right breast, no findings suspicious for malignancy. Images were processed with CAD. IMPRESSION: Further evaluation is suggested for possible asymmetry in the left breast. RECOMMENDATION: Diagnostic mammogram and possibly ultrasound of the left breast. (Code:[id number]-48M) The patient will be contacted regarding the findings, and additional imaging will be scheduled. BI-RADS CATEGORY  0: Incomplete. Need additional imaging evaluation and/or prior mammograms for comparison. Electronically Signed   By: Bary Richard M.D.   On: 12/05/2019 16:15    Pelvic/Bimanual Pap is not indicated today per BCCCP guidelines.   Smoking History: Patient has never smoked.   Patient Navigation: Patient education provided. Access to services provided for patient through BCCCP program. Spanish Natale Lay from [facility] interpreter provided. Patient has food insecurities. Patient escorted to the food market at the [employer] for groceries.   Colorectal Cancer Screening: Per patient has never had colonoscopy completed. FIT Test given to patient by her PCP in May 2023. Patient stated she has not completed the FIT Test. Patient stated she will complete and return to her PCP at her next visit. Explained the importance of completing the FIT Test. No complaints today.    Breast and Cervical Cancer Risk Assessment: Patient does not have family history of breast cancer, known genetic mutations, or radiation treatment to the chest before age 22. Patient does not have history of cervical dysplasia, immunocompromised, or DES exposure in-utero.  Risk Assessment   No risk assessment data for the current encounter  Risk Scores       12/16/2019   Last edited by: Fara Boros,  Sherie P, LPN   5-year  risk: 0.5 %   Lifetime risk: 4.6 %            A: BCCCP exam without pap smear No complaints.  P: Referred patient to the [facility] for a screening mammogram on mobile unit. Appointment scheduled Thursday, June 01, 2022 at 1040.  Priscille Heidelberg, RN 06/01/2022 9:07 AM

## 2022-07-24 ENCOUNTER — Ambulatory Visit: Payer: Self-pay | Admitting: Internal Medicine

## 2022-07-24 ENCOUNTER — Encounter: Payer: Self-pay | Admitting: Internal Medicine

## 2022-07-24 VITALS — BP 142/90 | HR 80 | Resp 16 | Ht <= 58 in | Wt 147.0 lb

## 2022-07-24 DIAGNOSIS — E119 Type 2 diabetes mellitus without complications: Secondary | ICD-10-CM

## 2022-07-24 DIAGNOSIS — Z23 Encounter for immunization: Secondary | ICD-10-CM

## 2022-07-24 DIAGNOSIS — E782 Mixed hyperlipidemia: Secondary | ICD-10-CM

## 2022-07-24 DIAGNOSIS — E669 Obesity, unspecified: Secondary | ICD-10-CM

## 2022-07-24 DIAGNOSIS — Z9189 Other specified personal risk factors, not elsewhere classified: Secondary | ICD-10-CM

## 2022-07-24 DIAGNOSIS — R7401 Elevation of levels of liver transaminase levels: Secondary | ICD-10-CM

## 2022-07-24 DIAGNOSIS — I10 Essential (primary) hypertension: Secondary | ICD-10-CM

## 2022-07-24 MED ORDER — METOPROLOL TARTRATE 25 MG PO TABS: 25.0000 mg | ORAL_TABLET | Freq: Two times a day (BID) | ORAL | 11 refills | Status: DC

## 2022-07-24 MED ORDER — LOSARTAN POTASSIUM 100 MG PO TABS: ORAL_TABLET | ORAL | 11 refills | Status: DC

## 2022-07-24 NOTE — Progress Notes (Signed)
Subjective:    Patient ID: Tamara Skinner, female   DOB: July 03, 1971, 51 y.o.   MRN: [id number]   HPI  Tamara Skinner interprets   Hypertension:  remains high.  Her directions for use of Losartan did not get switched from the 50 mg tabs to take 2 tabs once daily to decrease to 1 tab daily with the 100 mg strength.  2.  DM:  Has not had labs done yet.  Does check blood glucose at home.  Generally running from 90 - 130 both times daily (before breakfast and dinner).  A1C was a bit high at 7.4% in March with last check.   She states has had eye check at [facility] with exam that was fine.    3.  Hyperlipidemia:  essentially at goal in March.    4.  History of anemia:  CBC fine in March with iron supplementation.  Did not bring FIT back after CPE in March.    5.  Dental decay:  never got in for dental visit.  She is currently going to another dentist where her teeth are getting attention one by one.  [facility] on [facility].  Planning for partials vs dentures.    6.  Elevated ALT:  Viral Hepatitis serology negative in 2020.  Not immune to Hep B.  7.  HM:  Did not come for influenza vaccine clinic on Sept 18th.  Has not had influenza, new COVID vaccine, nor her second Shingrix.    Current Meds  Medication Sig   AgaMatrix Ultra-Thin Lancets MISC Check blood glucose twice weekly before breakfast and twice weekly before dinner.   amLODipine (NORVASC) 10 MG tablet Take 1 tablet by mouth once daily   aspirin EC 81 MG tablet Take 81 mg by mouth daily. Swallow whole.   Blood Glucose Monitoring Suppl (AGAMATRIX PRESTO) w/Device KIT Check blood glucose in the morning before breakfast and before dinner each twice weekly   ferrous gluconate (FERGON) 324 MG tablet Take 1 tablet (324 mg total) by mouth daily with breakfast.   glucose blood (AGAMATRIX PRESTO TEST) test strip Check blood glucose twice weekly before breakfast and twice weekly before dinner   hydrochlorothiazide (HYDRODIURIL) 25 MG  tablet TAKE 1 TABLET BY MOUTH ONCE DAILY WITH  LOSARTAN  IN  THE  MORNING   ibuprofen (ADVIL) 200 MG tablet Take 200 mg by mouth every 6 (six) hours as needed.   losartan (COZAAR) 100 MG tablet TAKE 2 TABLETS BY MOUTH IN THE MORNING WITH  HCTZ   metFORMIN (GLUCOPHAGE) 1000 MG tablet TAKE 1 TABLET BY MOUTH TWICE DAILY WITH MEALS   simvastatin (ZOCOR) 20 MG tablet TAKE 1 TABLET BY MOUTH ONCE DAILY WITH SUPPER   Allergies  Allergen Reactions   Lisinopril Cough     Review of Systems    Objective:   BP (!) 142/90 (BP Location: Left Arm, Patient Position: Sitting, Cuff Size: Normal)   Pulse 80   Resp 16   Ht 4' 9.5" (1.461 m)   Wt 147 lb (66.7 kg)   LMP 12/28/2016 (Approximate)   BMI 31.26 kg/m   Physical Exam   Assessment & Plan   Hypertension:  Decrease Losartan to 100 mg daily.  Add Metoprolol 25 mg twice daily.  Continue Amlodipine and HCTZ.  CMP, repeat BP and pulse check in 2 weeks.  2.  DM:  A1C.  Reportedly under good control.  Encouraged work on diet and physical activity to prevent escalation of medication  for this and BP.  3.  Obesity:  as above.  4.  History of anemia:  continues on iron and hemoglobin fine in March.  Given another FIT to return with BP check in 2 weeks.  5.  Elevated ALT:  As in #1,2, and 3.  Likely fatty liver.  6.  HM:  Shingrix #2, Hepatitis #1/3  7.  Dental decay:  obtaining care at [facility].

## 2022-07-25 LAB — COMPREHENSIVE METABOLIC PANEL
ALT: 75 IU/L — ABNORMAL HIGH (ref 0–32)
AST: 51 IU/L — ABNORMAL HIGH (ref 0–40)
Albumin/Globulin Ratio: 1.4 (ref 1.2–2.2)
Albumin: 4.5 g/dL (ref 3.8–4.9)
Alkaline Phosphatase: 112 IU/L (ref 44–121)
BUN/Creatinine Ratio: 28 — ABNORMAL HIGH (ref 9–23)
BUN: 18 mg/dL (ref 6–24)
Bilirubin Total: 0.4 mg/dL (ref 0.0–1.2)
CO2: 19 mmol/L — ABNORMAL LOW (ref 20–29)
Calcium: 9.5 mg/dL (ref 8.7–10.2)
Chloride: 96 mmol/L (ref 96–106)
Creatinine, Ser: 0.65 mg/dL (ref 0.57–1.00)
Globulin, Total: 3.2 g/dL (ref 1.5–4.5)
Glucose: 192 mg/dL — ABNORMAL HIGH (ref 70–99)
Potassium: 3.5 mmol/L (ref 3.5–5.2)
Sodium: 136 mmol/L (ref 134–144)
Total Protein: 7.7 g/dL (ref 6.0–8.5)
eGFR: 105 mL/min/{1.73_m2} (ref >59)

## 2022-07-25 LAB — HGB A1C W/O EAG: Hgb A1c MFr Bld: 8 % — ABNORMAL HIGH (ref 4.8–5.6)

## 2022-08-07 ENCOUNTER — Ambulatory Visit: Payer: Self-pay

## 2022-08-07 VITALS — BP 136/80 | HR 60

## 2022-08-07 DIAGNOSIS — Z0130 Encounter for examination of blood pressure without abnormal findings: Secondary | ICD-10-CM

## 2022-08-07 DIAGNOSIS — Z1211 Encounter for screening for malignant neoplasm of colon: Secondary | ICD-10-CM

## 2022-08-07 LAB — POC FIT TEST STOOL: Fecal Occult Blood: NEGATIVE

## 2022-08-08 NOTE — Progress Notes (Signed)
Patient has been taking bp medication consistently  After reporting bp to Dr Mulberry, no changes to medication will be made  

## 2022-08-15 ENCOUNTER — Ambulatory Visit: Payer: Self-pay | Admitting: Internal Medicine

## 2022-08-21 ENCOUNTER — Ambulatory Visit: Payer: Self-pay | Admitting: Internal Medicine

## 2022-10-03 ENCOUNTER — Ambulatory Visit: Payer: Self-pay | Admitting: Internal Medicine

## 2022-10-03 ENCOUNTER — Encounter: Payer: Self-pay | Admitting: Internal Medicine

## 2022-10-03 VITALS — BP 140/80 | HR 60 | Resp 12 | Ht <= 58 in | Wt 146.0 lb

## 2022-10-03 DIAGNOSIS — R809 Proteinuria, unspecified: Secondary | ICD-10-CM

## 2022-10-03 DIAGNOSIS — E119 Type 2 diabetes mellitus without complications: Secondary | ICD-10-CM

## 2022-10-03 DIAGNOSIS — E782 Mixed hyperlipidemia: Secondary | ICD-10-CM

## 2022-10-03 DIAGNOSIS — D509 Iron deficiency anemia, unspecified: Secondary | ICD-10-CM

## 2022-10-03 DIAGNOSIS — I10 Essential (primary) hypertension: Secondary | ICD-10-CM

## 2022-10-03 DIAGNOSIS — Z23 Encounter for immunization: Secondary | ICD-10-CM

## 2022-10-03 MED ORDER — CARVEDILOL 6.25 MG PO TABS: 6.2500 mg | ORAL_TABLET | Freq: Two times a day (BID) | ORAL | 11 refills | Status: DC

## 2022-10-03 MED ORDER — EMPAGLIFLOZIN 10 MG PO TABS: 10.0000 mg | ORAL_TABLET | Freq: Every day | ORAL | 11 refills | Status: DC

## 2022-10-03 NOTE — Progress Notes (Signed)
Subjective:    Patient ID: Tamara Skinner, female   DOB: 1971/06/07, 51 y.o.   MRN: [id number]   HPI  Tamara Skinner interprets   Hypertension:  BP up today.  Looking back at BPs for past 2 years, she is either high normal or high with BPs, Weight has not changed significantly.  Describes yardwork or walking regularly.  Eating more vegetables.   She states she is not missing Amlodipine, HCTZ, Losartan, and Metoprolol.  Her HR is low normal at 60.  Losartan instructions on bottle for her to take two 100 mg tabs, which she has not been doing and must be a typo   Checking with pharmacy on fill rates of bp meds as has more pills than would expect.     2.  DM:  A1C has gradually increased over past year away from goal.  6.9% just over a year ago and most recently, 8.0%.  She states again, she is not missing meds.  We have discussed at length lifestyle changes to get this back into range without success. Has had diabetic eye exam with [facility]--all was good.    3.  Hyperlipidemia:  was at goal in March.    4.  Mild microalbuminuria:  discussed and on Losartan  5.  HM:  To get Hep B and Spikevax today.  Discussed FIT was negative for blood.    Current Meds  Medication Sig   AgaMatrix Ultra-Thin Lancets MISC Check blood glucose twice weekly before breakfast and twice weekly before dinner.   amLODipine (NORVASC) 10 MG tablet Take 1 tablet by mouth once daily   aspirin EC 81 MG tablet Take 81 mg by mouth daily. Swallow whole.   Blood Glucose Monitoring Suppl (AGAMATRIX PRESTO) w/Device KIT Check blood glucose in the morning before breakfast and before dinner each twice weekly   ferrous gluconate (FERGON) 324 MG tablet Take 1 tablet (324 mg total) by mouth daily with breakfast.   glucose blood (AGAMATRIX PRESTO TEST) test strip Check blood glucose twice weekly before breakfast and twice weekly before dinner   hydrochlorothiazide (HYDRODIURIL) 25 MG tablet TAKE 1 TABLET BY MOUTH ONCE  DAILY WITH  LOSARTAN  IN  THE  MORNING   ibuprofen (ADVIL) 200 MG tablet Take 200 mg by mouth every 6 (six) hours as needed.   losartan (COZAAR) 100 MG tablet TAKE 1 TABLET BY MOUTH IN THE MORNING WITH  HCTZ   metFORMIN (GLUCOPHAGE) 1000 MG tablet TAKE 1 TABLET BY MOUTH TWICE DAILY WITH MEALS   metoprolol tartrate (LOPRESSOR) 25 MG tablet Take 1 tablet (25 mg total) by mouth 2 (two) times daily.   simvastatin (ZOCOR) 20 MG tablet TAKE 1 TABLET BY MOUTH ONCE DAILY WITH SUPPER   Allergies  Allergen Reactions   Lisinopril Cough     Review of Systems    Objective:   BP (!) 140/80 (BP Location: Left Arm, Patient Position: Sitting, Cuff Size: Normal)   Pulse 60   Resp 12   Ht 4' 9.5" (1.461 m)   Wt 146 lb (66.2 kg)   LMP 12/28/2016 (Approximate)   BMI 31.05 kg/m   Physical Exam NAD HEENT:  PERRL, EOMI Neck:  Supple, No adenopathy Chest: CTA CV:  RRR with normal S1 and S2, No S3, S4 or murmur.  Radial and DP pulses normal and equal Abd:  S, NT, No HSM or mass, + BS LE:  No edema.   Assessment & Plan    Hypertension:  Not  controlled.  And pharmacy confirms she is picking up meds appropriately.  We did get them to change the directions to 100 mg once daily for Losartan.  Switch Metoprolol to Carvedilol and see if has better control.  BP check in 2 weeks.  2.  DM/microalbuminuria:  Add Jardiance 10 mg once daily.  A1C in 3 months before CPE along with other fasting labs. Encouraged her to work on lifestyle changes again.  3.   Hyperlipidemia:  at goal previously this year.  4.  History of microcytic anemia:  resolved.  FIT negative.  This was more of a problem in 2022.  May decrease iron to 3 times weekly, but admits problem with good diet at times.

## 2022-10-17 ENCOUNTER — Ambulatory Visit: Payer: Self-pay

## 2022-10-17 VITALS — BP 136/96 | HR 60

## 2022-10-17 DIAGNOSIS — Z0130 Encounter for examination of blood pressure without abnormal findings: Secondary | ICD-10-CM

## 2022-10-17 NOTE — Progress Notes (Signed)
Patient reported that she has been taking bp medication consistently.  Will recheck bp in 1 month

## 2022-10-30 DIAGNOSIS — R87810 Cervical high risk human papillomavirus (HPV) DNA test positive: Secondary | ICD-10-CM | POA: Insufficient documentation

## 2022-10-30 HISTORY — DX: Cervical high risk human papillomavirus (HPV) DNA test positive: R87.810

## 2022-11-21 ENCOUNTER — Other Ambulatory Visit: Payer: Self-pay

## 2022-11-30 ENCOUNTER — Ambulatory Visit: Payer: Self-pay

## 2022-11-30 VITALS — BP 144/90 | HR 60

## 2022-11-30 DIAGNOSIS — Z0130 Encounter for examination of blood pressure without abnormal findings: Secondary | ICD-10-CM

## 2022-11-30 NOTE — Progress Notes (Signed)
Patient reported that she has been taking bp medication consistently.  I called pharmacy and confirmed that patient has been compliant with all bp medications for the past 4 months.

## 2023-01-16 ENCOUNTER — Other Ambulatory Visit: Payer: Self-pay

## 2023-01-16 DIAGNOSIS — D509 Iron deficiency anemia, unspecified: Secondary | ICD-10-CM

## 2023-01-16 DIAGNOSIS — E119 Type 2 diabetes mellitus without complications: Secondary | ICD-10-CM

## 2023-01-16 DIAGNOSIS — R809 Proteinuria, unspecified: Secondary | ICD-10-CM

## 2023-01-16 DIAGNOSIS — Z23 Encounter for immunization: Secondary | ICD-10-CM

## 2023-01-16 DIAGNOSIS — Z79899 Other long term (current) drug therapy: Secondary | ICD-10-CM

## 2023-01-16 DIAGNOSIS — E782 Mixed hyperlipidemia: Secondary | ICD-10-CM

## 2023-01-17 LAB — COMPREHENSIVE METABOLIC PANEL
ALT: 32 IU/L (ref 0–32)
AST: 25 IU/L (ref 0–40)
Albumin/Globulin Ratio: 1.3 (ref 1.2–2.2)
Albumin: 4 g/dL (ref 3.8–4.9)
Alkaline Phosphatase: 100 IU/L (ref 44–121)
BUN/Creatinine Ratio: 35 — ABNORMAL HIGH (ref 9–23)
BUN: 20 mg/dL (ref 6–24)
Bilirubin Total: 0.5 mg/dL (ref 0.0–1.2)
CO2: 20 mmol/L (ref 20–29)
Calcium: 9 mg/dL (ref 8.7–10.2)
Chloride: 101 mmol/L (ref 96–106)
Creatinine, Ser: 0.57 mg/dL (ref 0.57–1.00)
Globulin, Total: 3.1 g/dL (ref 1.5–4.5)
Glucose: 157 mg/dL — ABNORMAL HIGH (ref 70–99)
Potassium: 3.6 mmol/L (ref 3.5–5.2)
Sodium: 135 mmol/L (ref 134–144)
Total Protein: 7.1 g/dL (ref 6.0–8.5)
eGFR: 109 mL/min/{1.73_m2} (ref >59)

## 2023-01-17 LAB — CBC WITH DIFFERENTIAL/PLATELET
Basophils Absolute: 0 10*3/uL (ref 0.0–0.2)
Basos: 1 %
EOS (ABSOLUTE): 0.1 10*3/uL (ref 0.0–0.4)
Eos: 2 %
Hematocrit: 43.3 % (ref 34.0–46.6)
Hemoglobin: 14.8 g/dL (ref 11.1–15.9)
Immature Grans (Abs): 0 10*3/uL (ref 0.0–0.1)
Immature Granulocytes: 0 %
Lymphocytes Absolute: 1.1 10*3/uL (ref 0.7–3.1)
Lymphs: 21 %
MCH: 29.2 pg (ref 26.6–33.0)
MCHC: 34.2 g/dL (ref 31.5–35.7)
MCV: 86 fL (ref 79–97)
Monocytes Absolute: 0.5 10*3/uL (ref 0.1–0.9)
Monocytes: 9 %
Neutrophils Absolute: 3.6 10*3/uL (ref 1.4–7.0)
Neutrophils: 67 %
Platelets: 165 10*3/uL (ref 150–450)
RBC: 5.06 x10E6/uL (ref 3.77–5.28)
RDW: 12.3 % (ref 11.7–15.4)
WBC: 5.3 10*3/uL (ref 3.4–10.8)

## 2023-01-17 LAB — MICROALBUMIN / CREATININE URINE RATIO
Creatinine, Urine: 7.5 mg/dL
Microalbumin, Urine: 5.7 ug/mL

## 2023-01-17 LAB — HEMOGLOBIN A1C
Est. average glucose Bld gHb Est-mCnc: 151 mg/dL
Hgb A1c MFr Bld: 6.9 % — ABNORMAL HIGH (ref 4.8–5.6)

## 2023-01-17 LAB — LIPID PANEL W/O CHOL/HDL RATIO
Cholesterol, Total: 145 mg/dL (ref 100–199)
HDL: 54 mg/dL (ref >39)
LDL Chol Calc (NIH): 75 mg/dL (ref 0–99)
Triglycerides: 86 mg/dL (ref 0–149)
VLDL Cholesterol Cal: 16 mg/dL (ref 5–40)

## 2023-01-22 ENCOUNTER — Ambulatory Visit: Payer: Self-pay | Admitting: Internal Medicine

## 2023-01-22 ENCOUNTER — Encounter: Payer: Self-pay | Admitting: Internal Medicine

## 2023-01-22 ENCOUNTER — Other Ambulatory Visit: Payer: Self-pay | Admitting: Internal Medicine

## 2023-01-22 VITALS — BP 130/82 | HR 60 | Resp 16 | Ht <= 58 in | Wt 140.0 lb

## 2023-01-22 DIAGNOSIS — E782 Mixed hyperlipidemia: Secondary | ICD-10-CM

## 2023-01-22 DIAGNOSIS — A630 Anogenital (venereal) warts: Secondary | ICD-10-CM

## 2023-01-22 DIAGNOSIS — R809 Proteinuria, unspecified: Secondary | ICD-10-CM

## 2023-01-22 DIAGNOSIS — Z0000 Encounter for general adult medical examination without abnormal findings: Secondary | ICD-10-CM

## 2023-01-22 DIAGNOSIS — E119 Type 2 diabetes mellitus without complications: Secondary | ICD-10-CM

## 2023-01-22 DIAGNOSIS — I10 Essential (primary) hypertension: Secondary | ICD-10-CM

## 2023-01-22 DIAGNOSIS — Z124 Encounter for screening for malignant neoplasm of cervix: Secondary | ICD-10-CM

## 2023-01-22 DIAGNOSIS — Z1231 Encounter for screening mammogram for malignant neoplasm of breast: Secondary | ICD-10-CM

## 2023-01-22 DIAGNOSIS — B977 Papillomavirus as the cause of diseases classified elsewhere: Secondary | ICD-10-CM

## 2023-01-22 NOTE — Progress Notes (Addendum)
Subjective:    Patient ID: Tamara Skinner, female   DOB: 05/12/1971, 52 y.o.   MRN: [id number]   HPI  CPE with pap  1.  Pap:  Last pap 10/2019 with atrophy and otherwise normal.  2.  Mammogram:  Last 06/01/2022 and normal.  No family history of breast cancer.    3.  Osteoprevention:  Drinks 2 cups of milk daily.  States can drink one more.  She walks daily and takes stairs.    4.  Guaiac Cards/FIT:  Last 08/07/2022 and negative.    5.  Colonoscopy:  Never.  No family history of colon cancer.    6.  Immunizations:   Immunization History  Administered Date(s) Administered   Covid-19, Mrna,Vaccine(Spikevax)96yrs and older 10/03/2022   Hepatitis B, ADULT 07/24/2022, 10/03/2022, 01/16/2023   Influenza Inj Mdck Quad Pf 08/28/2019   Influenza,inj,Quad PF,6+ Mos 08/28/2017   Influenza-Unspecified 08/07/2018, 07/19/2020, 08/15/2021, 08/30/2022   Moderna Covid-19 Vaccine Bivalent Booster 33yrs & up 01/20/2022   Moderna Sars-Covid-2 Vaccination 01/19/2020, 02/16/2020, 08/23/2020, 02/23/2021   Pneumococcal Conjugate-13 08/28/2017   Pneumococcal Polysaccharide-23 05/22/2019   Tdap 08/28/2017   Zoster Recombinat (Shingrix) 01/20/2022, 07/24/2022     7.  Glucose/Cholesterol:  [place] for 1 month, apparently in [place].  States during the time period she noted much improved blood sugars.  States she was unable to obtain the [place] after January.  Not clear what the issue is.  A1C this month down to 6.9% from 8.0% in September.  States she is also eating better now that she has access to better food.   Discussed LDL, however, now above 70 at 75.  Was at goal same time last year. Lipid Panel     Component Value Date/Time   CHOL 145 01/16/2023 0844   TRIG 86 01/16/2023 0844   HDL 54 01/16/2023 0844   CHOLHDL 3.6 07/20/2017 1225   LDLCALC 75 01/16/2023 0844   LABVLDL 16 01/16/2023 0844     Current Meds  Medication Sig   AgaMatrix Ultra-Thin Lancets MISC Check blood glucose  twice weekly before breakfast and twice weekly before dinner.   amLODipine (NORVASC) 10 MG tablet Take 1 tablet by mouth once daily   aspirin EC 81 MG tablet Take 81 mg by mouth daily. Swallow whole.   Blood Glucose Monitoring Suppl (AGAMATRIX PRESTO) w/Device KIT Check blood glucose in the morning before breakfast and before dinner each twice weekly   carvedilol (COREG) 6.25 MG tablet Take 1 tablet (6.25 mg total) by mouth 2 (two) times daily with a meal.   ferrous gluconate (FERGON) 324 MG tablet Take 1 tablet (324 mg total) by mouth daily with breakfast.   glucose blood (AGAMATRIX PRESTO TEST) test strip Check blood glucose twice weekly before breakfast and twice weekly before dinner   hydrochlorothiazide (HYDRODIURIL) 25 MG tablet TAKE 1 TABLET BY MOUTH ONCE DAILY WITH  LOSARTAN  IN  THE  MORNING   ibuprofen (ADVIL) 200 MG tablet Take 200 mg by mouth every 6 (six) hours as needed.   losartan (COZAAR) 100 MG tablet TAKE 1 TABLET BY MOUTH IN THE MORNING WITH  HCTZ   metFORMIN (GLUCOPHAGE) 1000 MG tablet TAKE 1 TABLET BY MOUTH TWICE DAILY WITH MEALS   simvastatin (ZOCOR) 20 MG tablet TAKE 1 TABLET BY MOUTH ONCE DAILY WITH SUPPER   Allergies  Allergen Reactions   Lisinopril Cough   Past Medical History:  Diagnosis Date   Diabetes mellitus without complication ([place]) [id number]   Hyperlipidemia  2020   Hypertension 2016   Obesity (BMI 30.0-34.9)    Past Surgical History:  Procedure Laterality Date   TUBAL LIGATION     Family History  Problem Relation Age of Onset   Hypertension Mother    Diabetes Mother    Hypertension Father    Stroke Father        Three strokes   Diabetes Sister    Hypertension Sister    Diabetes Brother    Hypertension Brother    Diabetes Brother    Hypertension Brother    Breast cancer Neg Hx    Social History   Socioeconomic History   Marital status: Single    Spouse name: Not on file   Number of children: 4   Years of education: 12   Highest education  level: High school graduate  Occupational History   Occupation: Hotel room cleaning  Tobacco Use   Smoking status: Never    Passive exposure: Never   Smokeless tobacco: Never  Vaping Use   Vaping Use: Never used  Substance and Sexual Activity   Alcohol use: No   Drug use: No   Sexual activity: Not Currently    Birth control/protection: Surgical  Other Topics Concern   Not on file  Social History Narrative   Lives with her sister and 2 nieces.     Her children are now all adults   2 children live in [place]   1 child in [place]   1 child in [place] separately   Social Determinants of Health   Financial Resource Strain: Low Risk  (01/22/2023)   Overall Financial Resource Strain (CARDIA)    Difficulty of Paying Living Expenses: Not very hard  Food Insecurity: No Food Insecurity (01/22/2023)   Hunger Vital Sign    Worried About Running Out of Food in the Last Year: Never true    Ran Out of Food in the Last Year: Never true  Transportation Needs: No Transportation Needs (01/22/2023)   PRAPARE - Hydrologist (Medical): No    Lack of Transportation (Non-Medical): No  Physical Activity: Insufficiently Active (11/20/2019)   Exercise Vital Sign    Days of Exercise per Week: 7 days    Minutes of Exercise per Session: 20 min  Stress: Stress Concern Present (11/21/2018)   [place]    Feeling of Stress : Very much  Social Connections: Somewhat Isolated (11/21/2018)   Social Connection and Isolation Panel [NHANES]    Frequency of Communication with Friends and Family: More than three times a week    Frequency of Social Gatherings with Friends and Family: Twice a week    Attends Religious Services: More than 4 times per year    Active Member of [employer] or Organizations: No    Attends Archivist Meetings: Never    Marital Status: Never married  Intimate Partner Violence: Not At Risk  (11/20/2019)   Humiliation, Afraid, Rape, and Kick questionnaire    Fear of Current or Ex-Partner: No    Emotionally Abused: No    Physically Abused: No    Sexually Abused: No      Review of Systems  HENT:  Negative for dental problem (Had caried teeth removed.  They are making a partial for upper teeth and dentures for lower jaw.).   Eyes:  Negative for visual disturbance (Had eye check at [facility] in July.).  Respiratory:  Negative for shortness of breath.  Cardiovascular:  Negative for chest pain, palpitations and leg swelling.  Gastrointestinal:  Negative for abdominal pain and blood in stool (No melena.).  Skin:        Tick bite medial left ankle 9 months ago.   She believes it was not attached for more than 2 hours.   No history of rash or redness around the area where tick was attached. It had filled with some blood.   She is certain it was not there when she showered 24 hours prior.      Objective:   BP 130/82 (BP Location: Left Arm, Patient Position: Sitting, Cuff Size: Normal)   Pulse 60   Resp 16   Ht 4' 9.5" (1.461 m)   Wt 140 lb (63.5 kg)   LMP 12/28/2016 (Approximate)   BMI 29.77 kg/m   Physical Exam HENT:     Head: Normocephalic and atraumatic.     Right Ear: Tympanic membrane, ear canal and external ear normal.     Left Ear: Tympanic membrane, ear canal and external ear normal.     Nose: Nose normal.     Mouth/Throat:     Mouth: Mucous membranes are moist.     Pharynx: Oropharynx is clear.     Comments: No teeth lower jaw.  Multiple missing teeth of upper jaw. Eyes:     Extraocular Movements: Extraocular movements intact.     Conjunctiva/sclera: Conjunctivae normal.     Pupils: Pupils are equal, round, and reactive to light.     Comments: Discs sharp  Neck:     Thyroid: No thyroid mass or thyromegaly.  Cardiovascular:     Rate and Rhythm: Normal rate and regular rhythm.     Heart sounds: S1 normal and S2 normal. No murmur heard.     No friction rub. No S3 or S4 sounds.     Comments: No carotid bruits.  Carotid, radial, femoral, DP and PT pulses normal and equal.   Pulmonary:     Effort: Pulmonary effort is normal.     Breath sounds: Normal breath sounds and air entry.  Chest:  Breasts:    Right: No inverted nipple, mass or nipple discharge.     Left: No inverted nipple, mass or nipple discharge.  Abdominal:     General: Bowel sounds are normal.     Palpations: Abdomen is soft. There is no hepatomegaly, splenomegaly or mass.     Tenderness: There is no abdominal tenderness.     Hernia: No hernia is present.  Genitourinary:    Comments: Normal external female genitalia Large cystocele No discharge or vaginal mucosal lesion Encircling cervical os, very vascular mucosa What appears to be a wart on a stalk, about 3-4 mm appears at about 9 O'Clock with patient in supine position.   Musculoskeletal:        General: Normal range of motion.     Cervical back: Normal range of motion and neck supple.     Right lower leg: No edema.     Left lower leg: No edema.  Feet:     Comments: Diabetic Foot Exam - Simple   Simple Foot Form Diabetic Foot exam was performed with the following findings: Yes  01/22/2023  9:37 AM  Visual Inspection No deformities, no ulcerations, no other skin breakdown bilaterally: Yes Sensation Testing Intact to touch and monofilament testing bilaterally: Yes Pulse Check Posterior Tibialis and Dorsalis pulse intact bilaterally: Yes Comments    Lymphadenopathy:     Head:  Right side of head: No submental or submandibular adenopathy.     Left side of head: No submental or submandibular adenopathy.     Cervical: No cervical adenopathy.     Upper Body:     Right upper body: No supraclavicular or axillary adenopathy.     Left upper body: No supraclavicular or axillary adenopathy.     Lower Body: No right inguinal adenopathy. No left inguinal adenopathy.  Skin:    General: Skin is warm.      Capillary Refill: Capillary refill takes less than 2 seconds.     Findings: Lesion (2 mm scar medial left ankle.) present.  Neurological:     General: No focal deficit present.     Mental Status: She is alert and oriented to person, place, and time.     Cranial Nerves: Cranial nerves 2-12 are intact.     Sensory: Sensation is intact.     Motor: Motor function is intact.     Coordination: Coordination is intact.     Gait: Gait is intact.     Deep Tendon Reflexes: Reflexes are normal and symmetric.  Psychiatric:        Behavior: Behavior normal. Behavior is cooperative.      Assessment & Plan   CPE with pap and possible cervical wart--adding HPV no matter the results of pap. Mammogram in August FIT to return in 2 weeks.    2.  DM:  improved control to goal, but needs Jardiance.  [facility] pharm states the manufacturer was requiring a new Rx--not clear why.  They have no sample currently.  Will notify us when back in.    3.  Hyperlipidemia:  not quite at goal.  Discussed lifestyle changes to get back down.  4.  Hypertension:  controlled.    5.  Microalbuminuria:  repeating urine evaluation as previous sample not adequate.  Has only had Jardiance for 1 month, ending about 1 month ago.  Also on ARB

## 2023-01-24 LAB — MICROALBUMIN / CREATININE URINE RATIO
Creatinine, Urine: 57.3 mg/dL
Microalb/Creat Ratio: 8 mg/g creat (ref 0–29)
Microalbumin, Urine: 4.8 ug/mL

## 2023-01-25 LAB — CYTOLOGY - PAP

## 2023-01-31 NOTE — Addendum Note (Signed)
Addended by: Marcelino Duster on: 01/31/2023 10:01 PM   Modules accepted: Orders

## 2023-02-05 ENCOUNTER — Telehealth: Payer: Self-pay

## 2023-02-05 NOTE — Telephone Encounter (Signed)
Telephoned patient at mobile number. Left a voice message with [facility] contact information. 

## 2023-03-21 ENCOUNTER — Other Ambulatory Visit: Payer: Self-pay

## 2023-03-21 ENCOUNTER — Other Ambulatory Visit: Payer: Self-pay | Admitting: Internal Medicine

## 2023-04-19 ENCOUNTER — Other Ambulatory Visit: Payer: Self-pay | Admitting: Internal Medicine

## 2023-07-26 ENCOUNTER — Encounter: Payer: Self-pay | Admitting: Internal Medicine

## 2023-07-26 ENCOUNTER — Other Ambulatory Visit: Payer: Self-pay | Admitting: Obstetrics and Gynecology

## 2023-07-26 ENCOUNTER — Ambulatory Visit: Payer: Self-pay | Admitting: Internal Medicine

## 2023-07-26 VITALS — BP 138/88 | HR 58 | Resp 16 | Ht <= 58 in | Wt 143.0 lb

## 2023-07-26 DIAGNOSIS — E119 Type 2 diabetes mellitus without complications: Secondary | ICD-10-CM

## 2023-07-26 DIAGNOSIS — E782 Mixed hyperlipidemia: Secondary | ICD-10-CM

## 2023-07-26 DIAGNOSIS — F809 Developmental disorder of speech and language, unspecified: Secondary | ICD-10-CM

## 2023-07-26 DIAGNOSIS — Z23 Encounter for immunization: Secondary | ICD-10-CM

## 2023-07-26 DIAGNOSIS — I10 Essential (primary) hypertension: Secondary | ICD-10-CM

## 2023-07-26 DIAGNOSIS — Z1231 Encounter for screening mammogram for malignant neoplasm of breast: Secondary | ICD-10-CM

## 2023-07-26 NOTE — Progress Notes (Signed)
Subjective   Patient ID: Tamara Skinner, female    DOB: Mar 05, 1971  Age: 52 y.o. MRN: [id number]   HPI  DM:  Pharmacy did not have Jardiance in stock, asked to return. No interpreter available when she returned so she stopped trying to pick it up. Pharmacy has been trying to reach her via phone regarding prescription for Jardiance being sent to her home by the manufacturer, needed to bring prescription bottle to pharmacy for refill. Letter was sent to her home by pharmacy after multiple failed attempts to reach her via phone, however, she reports that she has not been receiving her mail due to not having a mailbox. A1C has improved, below 7.0%.  Lab Results  Component Value Date   HGBA1C 6.9 (H) 01/16/2023   HGBA1C 8.0 (H) 07/24/2022   HGBA1C 7.4 (H) 01/20/2022     Hyperlipidemia:  Cholesterol not where expected. Has been walking for at least 30 minutes, 4 times a week. Avoiding foods that are greasy and high in sugar, eating more chicken.  Lipid Panel     Component Value Date/Time   CHOL 145 01/16/2023 0844   TRIG 86 01/16/2023 0844   HDL 54 01/16/2023 0844   CHOLHDL 3.6 07/20/2017 1225   LDLCALC 75 01/16/2023 0844   LABVLDL 16 01/16/2023 0844    Hypertension:  Fairly controlled. Has been under a lot of stress at home with father passing away 3 months ago and son having an accident at work 2 months ago in [place]. She has had to work longer hours to send money to [place] for her son's medical expenses.   HM:  Recommend Influenza and COVID vaccine today.  Allergies  Allergen Reactions   Lisinopril Cough    Current Meds  Medication Sig   AgaMatrix Ultra-Thin Lancets MISC Check blood glucose twice weekly before breakfast and twice weekly before dinner.   amLODipine (NORVASC) 10 MG tablet Take 1 tablet by mouth once daily   aspirin EC 81 MG tablet Take 81 mg by mouth daily. Swallow whole.   Blood Glucose Monitoring Suppl (AGAMATRIX PRESTO) w/Device KIT Check blood glucose in  the morning before breakfast and before dinner each twice weekly   carvedilol (COREG) 6.25 MG tablet Take 1 tablet (6.25 mg total) by mouth 2 (two) times daily with a meal.   ferrous gluconate (FERGON) 324 MG tablet Take 1 tablet (324 mg total) by mouth daily with breakfast.   glucose blood (AGAMATRIX PRESTO TEST) test strip Check blood glucose twice weekly before breakfast and twice weekly before dinner   hydrochlorothiazide (HYDRODIURIL) 25 MG tablet TAKE 1 TABLET BY MOUTH ONCE DAILY WITH LOSARTAN IN THE MORNING.   ibuprofen (ADVIL) 200 MG tablet Take 200 mg by mouth every 6 (six) hours as needed.   losartan (COZAAR) 100 MG tablet TAKE 1 TABLET BY MOUTH IN THE MORNING WITH  HCTZ   metFORMIN (GLUCOPHAGE) 1000 MG tablet TAKE 1 TABLET BY MOUTH TWICE DAILY WITH MEALS   simvastatin (ZOCOR) 20 MG tablet TAKE 1 TABLET BY MOUTH ONCE DAILY WITH SUPPER     Review of Systems  Respiratory:  Negative for shortness of breath.   Cardiovascular:  Negative for chest pain, palpitations and leg swelling.  Neurological:  Negative for dizziness.      Objective:    Today's Vitals   07/26/23 0908  BP: 138/88  Pulse: (!) 58  Resp: 16  Weight: 143 lb (64.9 kg)  Height: 4' 9.5" (1.461 m)   Body  mass index is 30.41 kg/m.  BP 138/88 (BP Location: Left Arm, Patient Position: Sitting, Cuff Size: Normal)   Pulse (!) 58   Resp 16   Ht 4' 9.5" (1.461 m)   Wt 143 lb (64.9 kg)   LMP 12/28/2016 (Approximate)   BMI 30.41 kg/m    Physical Exam Constitutional:      Appearance: Normal appearance.  Neck:     Vascular: No carotid bruit.  Cardiovascular:     Rate and Rhythm: Normal rate and regular rhythm.     Pulses: Normal pulses.     Heart sounds: Murmur heard.  Pulmonary:     Breath sounds: Normal breath sounds.  Musculoskeletal:     Right lower leg: No edema.     Left lower leg: No edema.      The 10-year ASCVD risk score (Arnett DK, et al., 2019) is: 3.9%    Assessment & Plan:   DM:  Return to [facility] to re-apply for Jardiance. Instructed to call if she is unable to obtain Jardiance within the next 6 weeks. Improved A1C, repeat today.  Hyperlipidemia:  Lipid levels not at goal. Discussed diet and exercise. Will recheck lipid panel today, non-fasting.   HTN: fairly controlled. Continue present management.  HM:  Will give influenza and COVID vaccine today.  5.  Murmur: Already evaluated by Cardiology in 2021 with ECHO, no concerning findings.   Macarthur Critchley, Haroldine Laws FNP Student

## 2023-07-26 NOTE — Progress Notes (Signed)
Patient evaluated with Macarthur Critchley, Student-NP.  Agree with assessment and plan.

## 2023-07-27 LAB — LIPID PANEL W/O CHOL/HDL RATIO
Cholesterol, Total: 173 mg/dL (ref 100–199)
HDL: 57 mg/dL (ref >39)
LDL Chol Calc (NIH): 92 mg/dL (ref 0–99)
Triglycerides: 137 mg/dL (ref 0–149)
VLDL Cholesterol Cal: 24 mg/dL (ref 5–40)

## 2023-07-27 LAB — HGB A1C W/O EAG: Hgb A1c MFr Bld: 7.3 % — ABNORMAL HIGH (ref 4.8–5.6)

## 2023-08-14 ENCOUNTER — Ambulatory Visit: Payer: Self-pay | Admitting: Hematology and Oncology

## 2023-08-14 ENCOUNTER — Ambulatory Visit
Admission: RE | Admit: 2023-08-14 | Discharge: 2023-08-14 | Disposition: A | Discharge status: Home / Self Care | Payer: No Typology Code available for payment source | Source: Ambulatory Visit | Attending: Obstetrics and Gynecology | Admitting: Obstetrics and Gynecology

## 2023-08-14 VITALS — BP 188/97 | Wt 143.0 lb

## 2023-08-14 DIAGNOSIS — Z1231 Encounter for screening mammogram for malignant neoplasm of breast: Secondary | ICD-10-CM

## 2023-08-14 NOTE — Patient Instructions (Signed)
Taught Tamara Skinner about self breast awareness and gave educational materials to take home. Patient did not need a Pap smear today due to last Pap smear was in 01/22/2023 per patient.  Let her know [facility] will cover Pap smears every 5 years unless has a history of abnormal Pap smears. Referred patient to the [facility] for screening mammogram. Appointment scheduled for 08/14/2023. Patient aware of appointment and will be there. Let patient know will follow up with her within the next couple weeks with results. Tamara Skinner verbalized understanding.  Pascal Lux, NP 9:33 AM

## 2023-08-14 NOTE — Progress Notes (Signed)
[name] is a 52 y.o. female who presents to [facility] clinic today with no complaints.    Pap Smear: Pap not smear completed today. Last Pap smear was 01/22/2023 and was  normal with HPV+ . Per patient has no history of an abnormal Pap smear. Last Pap smear result is available in Epic.   Physical exam: Breasts Breasts symmetrical. No skin abnormalities bilateral breasts. No nipple retraction bilateral breasts. No nipple discharge bilateral breasts. No lymphadenopathy. No lumps palpated bilateral breasts.  MM 3D SCREEN BREAST BILATERAL  Result Date: 06/02/2022 CLINICAL DATA:  Screening. EXAM: DIGITAL SCREENING BILATERAL MAMMOGRAM WITH TOMOSYNTHESIS AND CAD TECHNIQUE: Bilateral screening digital craniocaudal and mediolateral oblique mammograms were obtained. Bilateral screening digital breast tomosynthesis was performed. The images were evaluated with computer-aided detection. COMPARISON:  Previous exam(s). ACR Breast Density Category c: The breast tissue is heterogeneously dense, which may obscure small masses. FINDINGS: There are no findings suspicious for malignancy. IMPRESSION: No mammographic evidence of malignancy. A result letter of this screening mammogram will be mailed directly to the patient. RECOMMENDATION: Screening mammogram in one year. (Code:SM-B-01Y) BI-RADS CATEGORY  1: Negative. Electronically Signed   By: Harmon Pier M.D.   On: 06/02/2022 15:57   MM DIAG BREAST TOMO UNI LEFT  Result Date: 12/16/2019 CLINICAL DATA:  Recall from baseline screening mammography with tomosynthesis, possible asymmetry involving the UPPER LEFT breast at MIDDLE depth. EXAM: DIGITAL DIAGNOSTIC UNILATERAL LEFT MAMMOGRAM WITH TOMO COMPARISON:  12/04/2019. ACR Breast Density Category c: The breast tissue is heterogeneously dense, which may obscure small masses. FINDINGS: Tomosynthesis and synthesized spot-compression CC and MLO views of the area of concern in the LEFT breast were obtained. The asymmetry  questioned on screening mammography disperses with compression and there is no underlying mass or architectural distortion. IMPRESSION: No mammographic evidence of malignancy involving the LEFT breast. RECOMMENDATION: Screening mammogram in one year.(Code:SM-B-01Y) I have discussed the findings and recommendations with the patient. Communication with the patient was achieved with the assistance of a certified interpreter. If applicable, a reminder letter will be sent to the patient regarding the next appointment. BI-RADS CATEGORY  1: Negative. Electronically Signed   By: Hulan Saas M.D.   On: 12/16/2019 15:17   MS DIGITAL SCREENING TOMO BILATERAL  Result Date: 12/05/2019 CLINICAL DATA:  Screening. EXAM: DIGITAL SCREENING BILATERAL MAMMOGRAM WITH TOMO AND CAD COMPARISON:  None. ACR Breast Density Category c: The breast tissue is heterogeneously dense, which may obscure small masses. FINDINGS: In the left breast, a possible asymmetry warrants further evaluation. This possible asymmetry is seen within the upper LEFT breast. In the right breast, no findings suspicious for malignancy. Images were processed with CAD. IMPRESSION: Further evaluation is suggested for possible asymmetry in the left breast. RECOMMENDATION: Diagnostic mammogram and possibly ultrasound of the left breast. (Code:FI-L-74M) The patient will be contacted regarding the findings, and additional imaging will be scheduled. BI-RADS CATEGORY  0: Incomplete. Need additional imaging evaluation and/or prior mammograms for comparison. Electronically Signed   By: Bary Richard M.D.   On: 12/05/2019 16:15         Pelvic/Bimanual Pap is not indicated today    Smoking History: Patient has never smoked and was not referred to quit line.    Patient Navigation: Patient education provided. Access to services provided for patient through [facility] program. Tamara Skinner interpreter provided. No transportation provided   Colorectal Cancer  Screening: Per patient has never had colonoscopy completed No complaints today. FIT test completed 08/19/2023; pending results  Breast and Cervical Cancer Risk Assessment: Patient does not have family history of breast cancer, known genetic mutations, or radiation treatment to the chest before age 1. Patient does not have history of cervical dysplasia, immunocompromised, or DES exposure in-utero.  Risk Scores as of Encounter on 08/14/2023     Tamara Skinner           5-year 0.82%   Lifetime 6.09%   This patient is [place] but has no documented birth country, so the [place] model used data from [place] patients to calculate their risk score. Document a birth country in the Demographics activity for a more accurate score.         Last calculated by Tamara Skinner, CMA on 08/14/2023 at  9:16 AM        A: [facility] exam without pap smear No complaints with benign exam.   P: Referred patient to the [facility] for a screening mammogram. Appointment scheduled 08/14/2023.  Pascal Lux, NP 08/14/2023 9:31 AM

## 2023-08-16 ENCOUNTER — Other Ambulatory Visit: Payer: Self-pay | Admitting: Psychology

## 2023-08-22 ENCOUNTER — Encounter: Payer: Self-pay | Admitting: Family Medicine

## 2023-08-22 ENCOUNTER — Other Ambulatory Visit: Payer: Self-pay | Admitting: Internal Medicine

## 2023-08-22 NOTE — Progress Notes (Signed)
Patient did not keep appointment today. She may call to reschedule.  

## 2023-09-18 ENCOUNTER — Other Ambulatory Visit: Payer: Self-pay | Admitting: Internal Medicine

## 2023-11-20 ENCOUNTER — Other Ambulatory Visit: Payer: Self-pay | Admitting: Internal Medicine

## 2024-01-18 ENCOUNTER — Other Ambulatory Visit: Payer: Self-pay

## 2024-01-18 DIAGNOSIS — E119 Type 2 diabetes mellitus without complications: Secondary | ICD-10-CM

## 2024-01-18 DIAGNOSIS — Z79899 Other long term (current) drug therapy: Secondary | ICD-10-CM

## 2024-01-18 DIAGNOSIS — E782 Mixed hyperlipidemia: Secondary | ICD-10-CM

## 2024-01-20 LAB — CBC WITH DIFFERENTIAL/PLATELET
Basophils Absolute: 0 10*3/uL (ref 0.0–0.2)
Basos: 1 %
EOS (ABSOLUTE): 0.2 10*3/uL (ref 0.0–0.4)
Eos: 3 %
Hematocrit: 43.8 % (ref 34.0–46.6)
Hemoglobin: 14.8 g/dL (ref 11.1–15.9)
Immature Grans (Abs): 0 10*3/uL (ref 0.0–0.1)
Immature Granulocytes: 0 %
Lymphocytes Absolute: 1.3 10*3/uL (ref 0.7–3.1)
Lymphs: 24 %
MCH: 30.3 pg (ref 26.6–33.0)
MCHC: 33.8 g/dL (ref 31.5–35.7)
MCV: 90 fL (ref 79–97)
Monocytes Absolute: 0.5 10*3/uL (ref 0.1–0.9)
Monocytes: 9 %
Neutrophils Absolute: 3.5 10*3/uL (ref 1.4–7.0)
Neutrophils: 63 %
Platelets: 187 10*3/uL (ref 150–450)
RBC: 4.88 x10E6/uL (ref 3.77–5.28)
RDW: 12.4 % (ref 11.7–15.4)
WBC: 5.5 10*3/uL (ref 3.4–10.8)

## 2024-01-20 LAB — COMPREHENSIVE METABOLIC PANEL
ALT: 28 IU/L (ref 0–32)
AST: 23 IU/L (ref 0–40)
Albumin: 4.1 g/dL (ref 3.8–4.9)
Alkaline Phosphatase: 127 IU/L — ABNORMAL HIGH (ref 44–121)
BUN/Creatinine Ratio: 32 — ABNORMAL HIGH (ref 9–23)
BUN: 21 mg/dL (ref 6–24)
Bilirubin Total: 0.3 mg/dL (ref 0.0–1.2)
CO2: 22 mmol/L (ref 20–29)
Calcium: 9.1 mg/dL (ref 8.7–10.2)
Chloride: 98 mmol/L (ref 96–106)
Creatinine, Ser: 0.65 mg/dL (ref 0.57–1.00)
Globulin, Total: 3 g/dL (ref 1.5–4.5)
Glucose: 165 mg/dL — ABNORMAL HIGH (ref 70–99)
Potassium: 3.8 mmol/L (ref 3.5–5.2)
Sodium: 134 mmol/L (ref 134–144)
Total Protein: 7.1 g/dL (ref 6.0–8.5)
eGFR: 105 mL/min/{1.73_m2} (ref >59)

## 2024-01-20 LAB — LIPID PANEL W/O CHOL/HDL RATIO
Cholesterol, Total: 140 mg/dL (ref 100–199)
HDL: 53 mg/dL (ref >39)
LDL Chol Calc (NIH): 61 mg/dL (ref 0–99)
Triglycerides: 156 mg/dL — ABNORMAL HIGH (ref 0–149)
VLDL Cholesterol Cal: 26 mg/dL (ref 5–40)

## 2024-01-20 LAB — HEMOGLOBIN A1C
Est. average glucose Bld gHb Est-mCnc: 166 mg/dL
Hgb A1c MFr Bld: 7.4 % — ABNORMAL HIGH (ref 4.8–5.6)

## 2024-01-20 LAB — MICROALBUMIN / CREATININE URINE RATIO: Creatinine, Urine: 8.3 mg/dL

## 2024-01-24 ENCOUNTER — Encounter: Payer: Self-pay | Admitting: Internal Medicine

## 2024-01-29 ENCOUNTER — Encounter: Payer: Self-pay | Admitting: Internal Medicine

## 2024-01-29 ENCOUNTER — Ambulatory Visit: Payer: Self-pay | Admitting: Internal Medicine

## 2024-01-29 ENCOUNTER — Other Ambulatory Visit: Payer: Self-pay | Admitting: Internal Medicine

## 2024-01-29 VITALS — BP 136/80 | HR 61 | Resp 18 | Ht <= 58 in | Wt 141.0 lb

## 2024-01-29 DIAGNOSIS — R87810 Cervical high risk human papillomavirus (HPV) DNA test positive: Secondary | ICD-10-CM

## 2024-01-29 DIAGNOSIS — Z91148 Patient's other noncompliance with medication regimen for other reason: Secondary | ICD-10-CM

## 2024-01-29 DIAGNOSIS — E119 Type 2 diabetes mellitus without complications: Secondary | ICD-10-CM

## 2024-01-29 DIAGNOSIS — Z1231 Encounter for screening mammogram for malignant neoplasm of breast: Secondary | ICD-10-CM

## 2024-01-29 DIAGNOSIS — E782 Mixed hyperlipidemia: Secondary | ICD-10-CM

## 2024-01-29 DIAGNOSIS — I10 Essential (primary) hypertension: Secondary | ICD-10-CM

## 2024-01-29 DIAGNOSIS — R809 Proteinuria, unspecified: Secondary | ICD-10-CM

## 2024-01-29 DIAGNOSIS — Z0000 Encounter for general adult medical examination without abnormal findings: Secondary | ICD-10-CM

## 2024-01-29 DIAGNOSIS — Z5982 Transportation insecurity: Secondary | ICD-10-CM | POA: Insufficient documentation

## 2024-01-29 MED ORDER — EMPAGLIFLOZIN 10 MG PO TABS: 10.0000 mg | ORAL_TABLET | Freq: Every day | ORAL | 11 refills | Status: AC

## 2024-01-29 NOTE — Progress Notes (Signed)
 Subjective:    Patient ID: Tamara Skinner, female   DOB: [birth year], 53 y.o.   MRN: [id number]   HPI  Amparo Bristol interprets  CPE with pap  1.  Pap:  History of what appeared to be a wart on her cervix 1 year ago with + HPV.  Pap was otherwise normal.  She was sent to gynecology at [facility], but was late for the appt and never reappointed as recommended at their office.    2.  Mammogram:  Last 07/2023 and normal.  No family history of breast cancer.  3.  Osteoprevention:  Eating or drinking 3 servings of milk/cheese daily.  Walks daily for 20 minutes, sometimes more.    4.  Guaiac Cards/FIT:  Last 07/2022 and negative  5.  Colonoscopy:  Never.  No family history of colon cancer.    6.  Immunizations:  Up to date Immunization History  Administered Date(s) Administered   Hepatitis B, ADULT 07/24/2022, 10/03/2022, 01/16/2023   Influenza Inj Mdck Quad Pf 08/28/2019   Influenza, Mdck, Trivalent,PF 6+ MOS(egg free) 07/26/2023   Influenza,inj,Quad PF,6+ Mos 08/28/2017   Influenza-Unspecified 08/07/2018, 07/19/2020, 08/15/2021, 08/30/2022   Moderna Covid-19 Fall Seasonal Vaccine 15yrs & older 10/03/2022, 07/26/2023   Moderna Covid-19 Vaccine Bivalent Booster 26yrs & up 01/20/2022   Moderna Sars-Covid-2 Vaccination 01/19/2020, 02/16/2020, 08/23/2020, 02/23/2021   Pneumococcal Conjugate-13 08/28/2017   Pneumococcal Polysaccharide-23 05/22/2019   Tdap 08/28/2017   Zoster Recombinant(Shingrix) 01/20/2022, 07/24/2022     7.  Glucose/Cholesterol:  Not taking Jardiance.  Never returned to complete application.  First written in 12.2023.  Last visit, she was instructed to notify this office if unable to obtain Jardiance after returning to [facility] pharmacy to reapply.  Does not sound like she attempted to refill.  A1C now at 7.4%.   Unable to ascertain what her barriers are.   Cholesterol recently at goal. Lipid Panel     Component Value Date/Time   CHOL 140 01/18/2024 0852   TRIG  156 (H) 01/18/2024 0852   HDL 53 01/18/2024 0852   CHOLHDL 3.6 07/20/2017 1225   LDLCALC 61 01/18/2024 0852   LABVLDL 26 01/18/2024 0852     Current Meds  Medication Sig   AgaMatrix Ultra-Thin Lancets MISC Check blood glucose twice weekly before breakfast and twice weekly before dinner.   amLODipine (NORVASC) 10 MG tablet Take 1 tablet by mouth once daily   aspirin EC 81 MG tablet Take 81 mg by mouth daily. Swallow whole.   Blood Glucose Monitoring Suppl (AGAMATRIX PRESTO) w/Device KIT Check blood glucose in the morning before breakfast and before dinner each twice weekly   carvedilol (COREG) 6.25 MG tablet TAKE 1 TABLET BY MOUTH TWICE DAILY WITH A MEAL. DISCONTINUE METOPROLOL.   ferrous gluconate (FERGON) 324 MG tablet Take 1 tablet (324 mg total) by mouth daily with breakfast.   glucose blood (AGAMATRIX PRESTO TEST) test strip Check blood glucose twice weekly before breakfast and twice weekly before dinner   hydrochlorothiazide (HYDRODIURIL) 25 MG tablet TAKE 1 TABLET BY MOUTH ONCE DAILY WITH LOSARTAN IN THE MORNING   ibuprofen (ADVIL) 200 MG tablet Take 200 mg by mouth every 6 (six) hours as needed.   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) 2024   Diabetes mellitus without complication (HCC) 2016   Elevated ALT measurement 11/21/2018   Hyperlipidemia 2020   Hypertension 2016   Microcytic anemia 01/02/2021   resolved   Obesity (BMI 30.0-34.9)    Onychomycosis of toenail 11/21/2018   Tinea pedis of both feet 11/21/2018   Past Surgical History:  Procedure Laterality Date   TUBAL LIGATION      Family History  Problem Relation Age of Onset   Hypertension Mother    Diabetes Mother    Hypertension Father    Stroke Father        Three strokes   Diabetes Sister    Hypertension Sister    Diabetes Brother    Hypertension Brother    Diabetes Brother    Hypertension Brother    Breast cancer Neg Hx    Social History   Socioeconomic History   Marital status: Single    Spouse name: Not on file   Number of children: 4   Years of education: 12   Highest education level: High school graduate  Occupational History   Occupation: Statistician   Occupation: Clean up for [employer]  Tobacco Use   Smoking status: Never    Passive exposure: Never   Smokeless tobacco: Never  Vaping Use   Vaping status: Never Used  Substance and Sexual Activity   Alcohol use: No   Drug use: No   Sexual activity: Not Currently    Birth control/protection: Surgical, Post-menopausal    Comment: Tubal ligation  Other Topics Concern   Not on file  Social History Narrative   Lives with her sister and 2 nieces.     Her children are now all adults   2 children live in [place]   1 child in [place]   1 child in [place] separately   Social Drivers of Health   Financial Resource Strain: Low Risk  (01/29/2024)   Overall Financial Resource Strain (CARDIA)    Difficulty of Paying Living Expenses: Not very hard  Food Insecurity: No Food Insecurity (01/29/2024)   Hunger Vital Sign    Worried About Running Out of Food in the Last Year: Never true    Ran Out of Food in the Last Year: Never true  Transportation Needs: Unmet Transportation Needs (01/29/2024)   PRAPARE - Administrator, Civil Service (Medical): Yes    Lack of Transportation (Non-Medical): No  Physical Activity: Insufficiently Active (11/20/2019)   Exercise Vital Sign    Days of Exercise per Week: 7 days    Minutes of Exercise per Session: 20 min  Stress: Stress Concern Present (11/21/2018)   [employer] of  Occupational Health - Occupational Stress Questionnaire    Feeling of Stress : Very much  Social Connections: Somewhat Isolated (11/21/2018)   Social Connection and Isolation Panel [NHANES]    Frequency of Communication with Friends and Family: More than three times a week    Frequency of Social Gatherings with Friends and Family: Twice a week    Attends Religious Services: More than 4 times per year    Active Member of [employer] or Organizations: No    Attends Banker Meetings: Never    Marital Status: Never married  Intimate Partner Violence: Not At Risk (11/20/2019)   Humiliation, Afraid, Rape, and Kick questionnaire  Fear of Current or Ex-Partner: No    Emotionally Abused: No    Physically Abused: No    Sexually Abused: No     Review of Systems  HENT:  Negative for dental problem (Partials.  Has private dentist.).   Eyes:  Negative for visual disturbance (Has had eye check for DM this year.  No concerns.).  Respiratory:  Negative for shortness of breath.   Cardiovascular:  Negative for chest pain, palpitations and leg swelling.  Gastrointestinal:  Negative for abdominal pain and blood in stool (No melena).  Neurological:  Negative for weakness and numbness.  Psychiatric/Behavioral:  Negative for dysphoric mood. The patient is not nervous/anxious.       Objective:   BP 136/80 (BP Location: Right Arm, Patient Position: Sitting, Cuff Size: Normal)   Pulse 61   Resp 18   Ht 4' 9.5" (1.461 m)   Wt 141 lb (64 kg)   LMP 12/28/2016 (Approximate)   BMI 29.98 kg/m   Physical Exam HENT:     Head: Normocephalic and atraumatic.     Right Ear: Tympanic membrane, ear canal and external ear normal.     Left Ear: Tympanic membrane, ear canal and external ear normal.     Nose: Nose normal.     Mouth/Throat:     Mouth: Mucous membranes are moist.     Pharynx: Oropharynx is clear.  Eyes:     Extraocular Movements: Extraocular movements intact.     Conjunctiva/sclera:  Conjunctivae normal.     Pupils: Pupils are equal, round, and reactive to light.     Comments: Discs sharp  Neck:     Thyroid: No thyroid mass or thyromegaly.  Cardiovascular:     Rate and Rhythm: Normal rate and regular rhythm.     Pulses:          Dorsalis pedis pulses are 2+ on the right side and 2+ on the left side.       Posterior tibial pulses are 2+ on the right side and 2+ on the left side.     Heart sounds: S1 normal and S2 normal. No murmur heard.    No friction rub. No S3 or S4 sounds.     Comments: No carotid bruits.  Carotid, radial, femoral, DP and PT pulses normal and equal.   Pulmonary:     Effort: Pulmonary effort is normal.     Breath sounds: Normal breath sounds and air entry.  Chest:  Breasts:    Right: No inverted nipple, mass or nipple discharge.     Left: No inverted nipple, mass or nipple discharge.  Abdominal:     General: Bowel sounds are normal.     Palpations: Abdomen is soft. There is no hepatomegaly, splenomegaly or mass.     Tenderness: There is no abdominal tenderness.     Hernia: No hernia is present.  Genitourinary:       Comments: Normal external female genitalia No uterine or adnexal mass or tenderness Large ectropion with wart or small endocervical polyp at patient's right cervical os Musculoskeletal:        General: Normal range of motion.     Cervical back: Normal range of motion and neck supple.     Right lower leg: No edema.     Left lower leg: No edema.  Feet:     Right foot:     Protective Sensation: 10 sites tested.  10 sites sensed.     Skin integrity: Skin integrity normal.  Toenail Condition: Right toenails are normal.     Left foot:     Protective Sensation: 10 sites tested.  10 sites sensed.     Skin integrity: Skin integrity normal.     Toenail Condition: Left toenails are normal.  Lymphadenopathy:     Head:     Right side of head: No submental or submandibular adenopathy.     Left side of head: No submental or  submandibular adenopathy.     Cervical: No cervical adenopathy.     Upper Body:     Right upper body: No supraclavicular or axillary adenopathy.     Left upper body: No supraclavicular or axillary adenopathy.     Lower Body: No right inguinal adenopathy. No left inguinal adenopathy.  Skin:    General: Skin is warm.     Capillary Refill: Capillary refill takes less than 2 seconds.     Findings: No rash.  Neurological:     General: No focal deficit present.     Mental Status: She is alert and oriented to person, place, and time.     Cranial Nerves: Cranial nerves 2-12 are intact.     Sensory: Sensation is intact.     Motor: Motor function is intact.     Coordination: Coordination is intact.     Gait: Gait is intact.     Deep Tendon Reflexes: Reflexes are normal and symmetric.  Psychiatric:        Mood and Affect: Mood normal.        Speech: Speech normal.        Behavior: Behavior normal. Behavior is cooperative.      Assessment & Plan   CPE with pap and HPV Did not follow through with gyn visit last year after she was late for appt.   Referring back to gyn Mammogram in October /Nov Influenza and COVID vaccines in Sept/Oct FIT to return in 2 weeks.  2.  DM with microalbuminuria:  Discussed barriers/motivation to care for her health.  Has been 1.25 years and still has not applied for Jardiance.   Will have Lanora Manis L follow up with her as CHW to see if she is getting support she needs to follow through.  Some transportation barrier, but also not clear she is concerned enough about her health issues to follow through.   Encouraged her to call office if problems getting referral appts. Looking into [facility] transportation for [place] visits.   3.  Hypertension:  controlled  4.  Hyperlipidemia:  controlled.

## 2024-01-29 NOTE — Patient Instructions (Signed)
 Asked her to call back in 2 weeks to let us know where her application for [place] Pepper is at.

## 2024-01-31 LAB — CYTOLOGY - PAP

## 2024-02-10 ENCOUNTER — Other Ambulatory Visit: Payer: Self-pay | Admitting: Internal Medicine

## 2024-03-16 ENCOUNTER — Other Ambulatory Visit: Payer: Self-pay | Admitting: Internal Medicine

## 2024-04-22 ENCOUNTER — Other Ambulatory Visit: Payer: Self-pay | Admitting: Obstetrics and Gynecology

## 2024-04-22 DIAGNOSIS — Z1231 Encounter for screening mammogram for malignant neoplasm of breast: Secondary | ICD-10-CM

## 2024-05-15 ENCOUNTER — Encounter: Payer: Self-pay | Admitting: Obstetrics & Gynecology

## 2024-05-15 ENCOUNTER — Telehealth: Payer: Self-pay | Admitting: Obstetrics & Gynecology

## 2024-05-15 NOTE — Telephone Encounter (Signed)
   Faculty Practice OB/GYN Physician Phone Call Documentation  We had a phone conversation with Tamara Skinner with the help of a Spanish interpreter about her upcoming scheduled appointment with our office today for follow up of her abnormal pap smear in 2024.  Patient's pap smear had normal cytology but +HPV in 2024.  Subsequent pap smear in 01/29/2024 showed negative cytology and negative HPV.     Notes from her PCP were reviewed, no other GYN issues, no other indications for visit today.  Patient was asked if she had any GYN concerns, she said she did not.    Given her recent negative pap smear, she needs repeat pap smear and HPV testing in 2026. No other intervention is needed at this point. This was discussed with patient. She was still offered to come in to discuss this further, she declined.  Message was also sent to her PCP with this recommendation.   GLORIS HUGGER, MD, FACOG Obstetrician & Gynecologist, [facility] for [employer], [facility]

## 2024-05-16 ENCOUNTER — Telehealth: Payer: Self-pay | Admitting: Internal Medicine

## 2024-05-16 NOTE — Telephone Encounter (Signed)
 Dawn form the [facility] called to inform Dr. Adella that the patient was in the lobby and they were going to dispense the Jardiance  for her. Dr. Adella is aware.

## 2024-05-30 ENCOUNTER — Other Ambulatory Visit: Payer: Self-pay

## 2024-06-05 ENCOUNTER — Ambulatory Visit: Payer: Self-pay | Admitting: Internal Medicine

## 2024-06-18 ENCOUNTER — Other Ambulatory Visit: Payer: Self-pay | Admitting: Internal Medicine

## 2024-07-01 ENCOUNTER — Other Ambulatory Visit: Payer: Self-pay

## 2024-07-01 LAB — OPHTHALMOLOGY REPORT-SCANNED

## 2024-07-01 MED ORDER — LOSARTAN POTASSIUM 100 MG PO TABS: ORAL_TABLET | ORAL | 3 refills | Status: AC

## 2024-07-14 ENCOUNTER — Other Ambulatory Visit: Payer: Self-pay | Admitting: Internal Medicine

## 2024-08-14 ENCOUNTER — Ambulatory Visit: Payer: Self-pay | Admitting: *Deleted

## 2024-08-14 VITALS — BP 148/86 | Wt 139.0 lb

## 2024-08-14 DIAGNOSIS — Z1211 Encounter for screening for malignant neoplasm of colon: Secondary | ICD-10-CM

## 2024-08-14 DIAGNOSIS — Z1239 Encounter for other screening for malignant neoplasm of breast: Secondary | ICD-10-CM

## 2024-08-14 NOTE — Progress Notes (Signed)
 Pt has been advised to express a need for transportation at the time she is scheduling her appts with the BCCCP program. She has been provided a [employer] today to ensure her ability to get home today. Pt was also advised this needs to be pre-planned as discussed.

## 2024-08-14 NOTE — Progress Notes (Signed)
 [name] is a 53 y.o. female who presents to [facility] today with no complaints.    Pap Smear: Pap smear not completed today. Last Pap smear was 01/29/2024 at [facility] and was normal with negative HPV. Per patient has no history of an abnormal Pap smear 01/22/2023 that was normal with positive HPV that no follow up was completed. Last Pap smear result is available in Epic.    Physical exam: Breasts Breasts symmetrical. No skin abnormalities bilateral breasts. No nipple retraction bilateral breasts. No nipple discharge bilateral breasts. No lymphadenopathy. No lumps palpated bilateral breasts. No complaints of pain or tenderness.       MS 3D SCR MAMMO BILAT BR (aka MM) Result Date: 08/16/2023 CLINICAL DATA:  Screening. EXAM: DIGITAL SCREENING BILATERAL MAMMOGRAM WITH TOMOSYNTHESIS AND CAD TECHNIQUE: Bilateral screening digital craniocaudal and mediolateral oblique mammograms were obtained. Bilateral screening digital breast tomosynthesis was performed. The images were evaluated with computer-aided detection. COMPARISON:  Previous exam(s). ACR Breast Density Category c: The breasts are heterogeneously dense, which may obscure small masses. FINDINGS: There are no findings suspicious for malignancy. IMPRESSION: No mammographic evidence of malignancy. A result letter of this screening mammogram will be mailed directly to the patient. RECOMMENDATION: Screening mammogram in one year. (Code:SM-B-01Y) BI-RADS CATEGORY  1: Negative. Electronically Signed   By: [name] Collet M.D.   On: 08/16/2023 12:55   MM 3D SCREEN BREAST BILATERAL Result Date: 06/02/2022 CLINICAL DATA:  Screening. EXAM: DIGITAL SCREENING BILATERAL MAMMOGRAM WITH TOMOSYNTHESIS AND CAD TECHNIQUE: Bilateral screening digital craniocaudal and mediolateral oblique mammograms were obtained. Bilateral screening digital breast tomosynthesis was performed. The images were evaluated with computer-aided detection. COMPARISON:   Previous exam(s). ACR Breast Density Category c: The breast tissue is heterogeneously dense, which may obscure small masses. FINDINGS: There are no findings suspicious for malignancy. IMPRESSION: No mammographic evidence of malignancy. A result letter of this screening mammogram will be mailed directly to the patient. RECOMMENDATION: Screening mammogram in one year. (Code:SM-B-01Y) BI-RADS CATEGORY  1: Negative. Electronically Signed   By: Reyes Phi M.D.   On: 06/02/2022 15:57   MM DIAG BREAST TOMO UNI LEFT Result Date: 12/16/2019 CLINICAL DATA:  Recall from baseline screening mammography with tomosynthesis, possible asymmetry involving the UPPER LEFT breast at MIDDLE depth. EXAM: DIGITAL DIAGNOSTIC UNILATERAL LEFT MAMMOGRAM WITH TOMO COMPARISON:  12/04/2019. ACR Breast Density Category c: The breast tissue is heterogeneously dense, which may obscure small masses. FINDINGS: Tomosynthesis and synthesized spot-compression CC and MLO views of the area of concern in the LEFT breast were obtained. The asymmetry questioned on screening mammography disperses with compression and there is no underlying mass or architectural distortion. IMPRESSION: No mammographic evidence of malignancy involving the LEFT breast. RECOMMENDATION: Screening mammogram in one year.(Code:SM-B-01Y) I have discussed the findings and recommendations with the patient. Communication with the patient was achieved with the assistance of a certified interpreter. If applicable, a reminder letter will be sent to the patient regarding the next appointment. BI-RADS CATEGORY  1: Negative. Electronically Signed   By: Debby Satterfield M.D.   On: 12/16/2019 15:17   MS DIGITAL SCREENING TOMO BILATERAL Result Date: 12/05/2019 CLINICAL DATA:  Screening. EXAM: DIGITAL SCREENING BILATERAL MAMMOGRAM WITH TOMO AND CAD COMPARISON:  None. ACR Breast Density Category c: The breast tissue is heterogeneously dense, which may obscure small masses. FINDINGS: In the  left breast, a possible asymmetry warrants further evaluation. This possible asymmetry is seen within the upper LEFT breast. In the right breast, no findings  suspicious for malignancy. Images were processed with CAD. IMPRESSION: Further evaluation is suggested for possible asymmetry in the left breast. RECOMMENDATION: Diagnostic mammogram and possibly ultrasound of the left breast. (Code:FI-L-27M) The patient will be contacted regarding the findings, and additional imaging will be scheduled. BI-RADS CATEGORY  0: Incomplete. Need additional imaging evaluation and/or prior mammograms for comparison. Electronically Signed   By: Lael Hines M.D.   On: 12/05/2019 16:15    Pelvic/Bimanual Pap is not indicated today per BCCCP guidelines.   Smoking History: Patient has never smoked.  Patient Navigation: Patient education provided. Access to services provided for patient through BCCCP program. [name] from [facility] interpreter provided.   Colorectal Cancer Screening: Per patient has never had colonoscopy completed. Per patient completed a FIT Test given by her PCP in 2024 that was negative. No complaints today.    Breast and Cervical Cancer Risk Assessment: Patient does not have family history of breast cancer, known genetic mutations, or radiation treatment to the chest before age 19. Patient does not have history of cervical dysplasia, immunocompromised, or DES exposure in-utero.  Risk Scores as of Encounter on 08/14/2024     Tamara Skinner           5-year 0.85%   Lifetime 5.98%   This patient is [place]/Latina but has no documented birth country, so the [place] model used data from [place] patients to calculate their risk score. Document a birth country in the Demographics activity for a more accurate score.         Last calculated by Silas, Ansyi K, CMA on 08/14/2024 at  9:15 AM        A: BCCCP exam without pap smear No complaints.  P: Referred patient to the [facility] of  [facility] for a screening mammogram on mobile unit. Appointment scheduled Thursday, August 21, 2024 at 1020.   Driscilla Wanda SQUIBB, RN 08/14/2024 9:22 AM

## 2024-08-14 NOTE — Patient Instructions (Signed)
 Explained breast self awareness with Tamara Skinner. Patient did not need a Pap smear today due to last Pap smear and HPV typing was 01/29/2024. Let her know that based on her history of her prior Pap smear HPV positive that her next Pap smear is due in one year. Referred patient to the [facility] of [facility] for a screening mammogram on mobile unit. Appointment scheduled Thursday, August 21, 2024 at 1020. Patient aware of appointment and will be there. Let patient know the Breast Center will follow up with her within the a couple weeks following her screening mammogram with results by letter or phone. Tamara Skinner verbalized understanding.  Tamara Skinner, Wanda Ship, RN 9:22 AM

## 2024-08-15 ENCOUNTER — Other Ambulatory Visit: Payer: Self-pay

## 2024-08-15 ENCOUNTER — Other Ambulatory Visit: Payer: Self-pay | Admitting: Internal Medicine

## 2024-08-15 DIAGNOSIS — E119 Type 2 diabetes mellitus without complications: Secondary | ICD-10-CM

## 2024-08-16 LAB — HEMOGLOBIN A1C
Est. average glucose Bld gHb Est-mCnc: 140 mg/dL
Hgb A1c MFr Bld: 6.5 % — ABNORMAL HIGH (ref 4.8–5.6)

## 2024-08-16 LAB — MICROALBUMIN / CREATININE URINE RATIO
Creatinine, Urine: 18.1 mg/dL
Microalb/Creat Ratio: 91 mg/g{creat} — ABNORMAL HIGH (ref 0–29)
Microalbumin, Urine: 16.5 ug/mL

## 2024-08-20 ENCOUNTER — Ambulatory Visit: Payer: Self-pay | Admitting: Internal Medicine

## 2024-08-20 ENCOUNTER — Encounter: Payer: Self-pay | Admitting: Internal Medicine

## 2024-08-20 VITALS — BP 140/80 | HR 60 | Resp 15 | Ht <= 58 in | Wt 137.0 lb

## 2024-08-20 DIAGNOSIS — E66811 Obesity, class 1: Secondary | ICD-10-CM

## 2024-08-20 DIAGNOSIS — Z23 Encounter for immunization: Secondary | ICD-10-CM

## 2024-08-20 DIAGNOSIS — R809 Proteinuria, unspecified: Secondary | ICD-10-CM

## 2024-08-20 DIAGNOSIS — E119 Type 2 diabetes mellitus without complications: Secondary | ICD-10-CM

## 2024-08-20 MED ORDER — OZEMPIC (0.25 OR 0.5 MG/DOSE) 2 MG/3ML ~~LOC~~ SOPN: PEN_INJECTOR | SUBCUTANEOUS | 11 refills | Status: AC

## 2024-08-20 MED ORDER — AGAMATRIX ULTRA-THIN LANCETS MISC: 11 refills | Status: AC

## 2024-08-20 MED ORDER — AGAMATRIX PRESTO TEST VI STRP: ORAL_STRIP | 11 refills | Status: AC

## 2024-08-20 NOTE — Progress Notes (Signed)
" ° ° °  Subjective:    Patient ID: Tamara Skinner, female   DOB: [birth year], 53 y.o.   MRN: [id number]   HPI  Tamara Skinner [id number]   DM:  Describes taking Jardiance  for 90 days, then has not been able to get refilled.  Not clear if she was supposed to go back to [facility] to pick up or exactly what the issue is.  No problems with the medicine.  Last dose was about 1 week ago.  A1C down to 6.5%.  Urine microalbumin/crea remains a bit elevated at 91.  Current Meds  Medication Sig   AgaMatrix Ultra-Thin Lancets MISC Check blood glucose twice weekly before breakfast and twice weekly before dinner.   amLODipine  (NORVASC ) 10 MG tablet Take 1 tablet by mouth once daily   aspirin EC 81 MG tablet Take 81 mg by mouth daily. Swallow whole.   Blood Glucose Monitoring Suppl (AGAMATRIX PRESTO) w/Device KIT Check blood glucose in the morning before breakfast and before dinner each twice weekly   carvedilol  (COREG ) 6.25 MG tablet TAKE 1 TABLET BY MOUTH TWICE DAILY WITH A MEAL (DISCONTINUE  METOPROLOL )   empagliflozin  (JARDIANCE ) 10 MG TABS tablet Take 1 tablet (10 mg total) by mouth daily before breakfast.   hydrochlorothiazide  (HYDRODIURIL ) 25 MG tablet TAKE 1 TABLET BY MOUTH ONCE DAILY IN THE MORNING WITH LOSARTAN    ibuprofen (ADVIL) 200 MG tablet Take 200 mg by mouth every 6 (six) hours as needed.   losartan  (COZAAR ) 100 MG tablet TAKE 1 TABLET BY MOUTH IN THE MORNING WITH  HYDROCHLOROTHIAZIDE    metFORMIN  (GLUCOPHAGE ) 1000 MG tablet TAKE 1 TABLET BY MOUTH TWICE DAILY WITH MEALS   simvastatin  (ZOCOR ) 20 MG tablet TAKE 1 TABLET BY MOUTH ONCE DAILY WITH SUPPER   Allergies  Allergen Reactions   Lisinopril  Cough     Review of Systems    Objective:   BP (!) 140/80 (BP Location: Left Arm, Patient Position: Sitting, Cuff Size: Normal)   Pulse 60   Resp 15   Ht 4' 9 (1.448 m)   Wt 137 lb (62.1 kg)   LMP 12/28/2016 (Approximate)   BMI 29.65 kg/m   Physical Exam HENT:     Head: Normocephalic and  atraumatic.  Eyes:     Extraocular Movements: Extraocular movements intact.     Conjunctiva/sclera: Conjunctivae normal.     Pupils: Pupils are equal, round, and reactive to light.  Cardiovascular:     Rate and Rhythm: Normal rate and regular rhythm.     Pulses: Normal pulses.     Heart sounds: Normal heart sounds. No murmur heard.    No friction rub.  Pulmonary:     Effort: Pulmonary effort is normal.     Breath sounds: Normal breath sounds.  Musculoskeletal:     Cervical back: Normal range of motion and neck supple.     Right lower leg: No edema.     Left lower leg: No edema.  Neurological:     Mental Status: She is alert.      Assessment & Plan   DM:  She will check with [facility] pharm for Jardiance  and call us  if unable to obtain.  She is interested in Ozempic  and weaning Metformin  as well.  Ozempic  written.  Follow up 2 weeks after starting.  Glucose logs and orders for glucometer supplies.  2.  HM:  [employer]  "

## 2024-08-21 ENCOUNTER — Inpatient Hospital Stay: Admission: RE | Admit: 2024-08-21 | Payer: Self-pay | Source: Ambulatory Visit

## 2024-08-29 ENCOUNTER — Other Ambulatory Visit: Payer: Self-pay | Admitting: Internal Medicine

## 2024-09-22 ENCOUNTER — Other Ambulatory Visit: Payer: Self-pay | Admitting: Internal Medicine

## 2024-10-21 ENCOUNTER — Other Ambulatory Visit: Payer: Self-pay | Admitting: Internal Medicine

## 2024-11-18 ENCOUNTER — Other Ambulatory Visit: Payer: Self-pay | Admitting: Internal Medicine

## 2024-11-18 NOTE — Telephone Encounter (Signed)
 Patient called stating she was unable to pick up medication because she did not  have refills- Per Epic system, refills were sent on 10/22/24 by Dr.Mulberry. I called the [facility] -   [id number] - [place] - [address], [place] ([place]) [place] [address] Phone: [phone]   per [facility], they did not receive the Rx and we need to resend it. Please inform patient once Rx is sent. Thank you

## 2024-12-04 ENCOUNTER — Other Ambulatory Visit: Payer: Self-pay | Admitting: Internal Medicine

## 2025-02-02 ENCOUNTER — Encounter: Payer: Self-pay | Admitting: Internal Medicine
